# Patient Record
Sex: Female | Born: 1940 | Race: White | Hispanic: No | Smoking: Current every day smoker
Health system: Southern US, Community
[De-identification: ages and names within clinical notes are randomized; demographics above are authoritative.]

## PROBLEM LIST (undated history)

## (undated) DIAGNOSIS — I4891 Unspecified atrial fibrillation: Secondary | ICD-10-CM

## (undated) DIAGNOSIS — S065XAA Traumatic subdural hemorrhage with loss of consciousness status unknown, initial encounter: Secondary | ICD-10-CM

## (undated) DIAGNOSIS — N183 Chronic kidney disease, stage 3 unspecified: Secondary | ICD-10-CM

## (undated) DIAGNOSIS — G4733 Obstructive sleep apnea (adult) (pediatric): Secondary | ICD-10-CM

## (undated) DIAGNOSIS — E785 Hyperlipidemia, unspecified: Secondary | ICD-10-CM

## (undated) DIAGNOSIS — R3129 Other microscopic hematuria: Secondary | ICD-10-CM

## (undated) DIAGNOSIS — I629 Nontraumatic intracranial hemorrhage, unspecified: Secondary | ICD-10-CM

## (undated) DIAGNOSIS — G4730 Sleep apnea, unspecified: Secondary | ICD-10-CM

## (undated) DIAGNOSIS — M1990 Unspecified osteoarthritis, unspecified site: Secondary | ICD-10-CM

## (undated) DIAGNOSIS — Z9289 Personal history of other medical treatment: Secondary | ICD-10-CM

## (undated) DIAGNOSIS — K635 Polyp of colon: Secondary | ICD-10-CM

## (undated) DIAGNOSIS — K5790 Diverticulosis of intestine, part unspecified, without perforation or abscess without bleeding: Secondary | ICD-10-CM

## (undated) DIAGNOSIS — I10 Essential (primary) hypertension: Secondary | ICD-10-CM

## (undated) DIAGNOSIS — M4800 Spinal stenosis, site unspecified: Secondary | ICD-10-CM

## (undated) DIAGNOSIS — S065X9A Traumatic subdural hemorrhage with loss of consciousness of unspecified duration, initial encounter: Secondary | ICD-10-CM

## (undated) HISTORY — DX: Essential (primary) hypertension: I10

## (undated) HISTORY — DX: Obstructive sleep apnea (adult) (pediatric): G47.33

## (undated) HISTORY — DX: Chronic kidney disease, stage 3 unspecified: N18.30

## (undated) HISTORY — DX: Sleep apnea, unspecified: G47.30

## (undated) HISTORY — DX: Diverticulosis of intestine, part unspecified, without perforation or abscess without bleeding: K57.90

## (undated) HISTORY — PX: APPENDECTOMY: SHX54

## (undated) HISTORY — DX: Unspecified osteoarthritis, unspecified site: M19.90

## (undated) HISTORY — DX: Polyp of colon: K63.5

## (undated) HISTORY — DX: Unspecified atrial fibrillation: I48.91

## (undated) HISTORY — DX: Nontraumatic intracranial hemorrhage, unspecified: I62.9

## (undated) HISTORY — DX: Traumatic subdural hemorrhage with loss of consciousness of unspecified duration, initial encounter: S06.5X9A

## (undated) HISTORY — DX: Personal history of other medical treatment: Z92.89

## (undated) HISTORY — DX: Other microscopic hematuria: R31.29

## (undated) HISTORY — DX: Chronic kidney disease, stage 3 (moderate): N18.3

## (undated) HISTORY — DX: Hyperlipidemia, unspecified: E78.5

## (undated) HISTORY — DX: Traumatic subdural hemorrhage with loss of consciousness status unknown, initial encounter: S06.5XAA

## (undated) HISTORY — DX: Spinal stenosis, site unspecified: M48.00

## (undated) HISTORY — PX: CHOLECYSTECTOMY: SHX55

---

## 1997-10-25 ENCOUNTER — Other Ambulatory Visit: Admission: RE | Admit: 1997-10-25 | Discharge: 1997-10-25 | Payer: Self-pay | Admitting: Family Medicine

## 1997-12-08 ENCOUNTER — Encounter: Payer: Self-pay | Admitting: Family Medicine

## 1997-12-08 ENCOUNTER — Ambulatory Visit (HOSPITAL_COMMUNITY): Admission: RE | Admit: 1997-12-08 | Discharge: 1997-12-08 | Payer: Self-pay | Admitting: Family Medicine

## 1997-12-21 ENCOUNTER — Ambulatory Visit (HOSPITAL_COMMUNITY): Admission: RE | Admit: 1997-12-21 | Discharge: 1997-12-21 | Payer: Self-pay | Admitting: Family Medicine

## 1997-12-24 ENCOUNTER — Observation Stay (HOSPITAL_COMMUNITY): Admission: RE | Admit: 1997-12-24 | Discharge: 1997-12-26 | Payer: Self-pay | Admitting: Gastroenterology

## 1997-12-24 ENCOUNTER — Encounter: Payer: Self-pay | Admitting: Gastroenterology

## 1998-01-06 ENCOUNTER — Ambulatory Visit (HOSPITAL_COMMUNITY): Admission: RE | Admit: 1998-01-06 | Discharge: 1998-01-06 | Payer: Self-pay | Admitting: *Deleted

## 1998-12-29 ENCOUNTER — Ambulatory Visit (HOSPITAL_COMMUNITY): Admission: RE | Admit: 1998-12-29 | Discharge: 1998-12-29 | Payer: Self-pay | Admitting: Gastroenterology

## 1998-12-29 ENCOUNTER — Encounter (INDEPENDENT_AMBULATORY_CARE_PROVIDER_SITE_OTHER): Payer: Self-pay | Admitting: Specialist

## 2000-04-11 ENCOUNTER — Other Ambulatory Visit: Admission: RE | Admit: 2000-04-11 | Discharge: 2000-04-11 | Payer: Self-pay | Admitting: Family Medicine

## 2000-12-08 ENCOUNTER — Ambulatory Visit (HOSPITAL_COMMUNITY): Admission: RE | Admit: 2000-12-08 | Discharge: 2000-12-08 | Payer: Self-pay | Admitting: Family Medicine

## 2000-12-08 ENCOUNTER — Encounter: Payer: Self-pay | Admitting: Family Medicine

## 2001-04-08 ENCOUNTER — Encounter (INDEPENDENT_AMBULATORY_CARE_PROVIDER_SITE_OTHER): Payer: Self-pay | Admitting: Specialist

## 2001-04-08 ENCOUNTER — Ambulatory Visit (HOSPITAL_COMMUNITY): Admission: RE | Admit: 2001-04-08 | Discharge: 2001-04-08 | Payer: Self-pay | Admitting: Gastroenterology

## 2008-03-19 ENCOUNTER — Emergency Department (HOSPITAL_COMMUNITY): Admission: EM | Admit: 2008-03-19 | Discharge: 2008-03-19 | Payer: Self-pay | Admitting: Emergency Medicine

## 2008-08-17 ENCOUNTER — Encounter
Admission: RE | Admit: 2008-08-17 | Discharge: 2008-11-15 | Payer: Self-pay | Admitting: Physical Medicine & Rehabilitation

## 2008-08-23 ENCOUNTER — Ambulatory Visit: Payer: Self-pay | Admitting: Physical Medicine & Rehabilitation

## 2008-09-23 ENCOUNTER — Ambulatory Visit: Payer: Self-pay | Admitting: Physical Medicine & Rehabilitation

## 2008-11-04 ENCOUNTER — Ambulatory Visit: Payer: Self-pay | Admitting: Physical Medicine & Rehabilitation

## 2008-12-15 ENCOUNTER — Encounter
Admission: RE | Admit: 2008-12-15 | Discharge: 2009-02-16 | Payer: Self-pay | Admitting: Physical Medicine & Rehabilitation

## 2008-12-16 ENCOUNTER — Ambulatory Visit: Payer: Self-pay | Admitting: Physical Medicine & Rehabilitation

## 2009-03-21 ENCOUNTER — Encounter
Admission: RE | Admit: 2009-03-21 | Discharge: 2009-03-29 | Payer: Self-pay | Admitting: Physical Medicine & Rehabilitation

## 2009-03-29 ENCOUNTER — Ambulatory Visit: Payer: Self-pay | Admitting: Physical Medicine & Rehabilitation

## 2009-05-24 ENCOUNTER — Ambulatory Visit: Payer: Self-pay | Admitting: Physical Medicine & Rehabilitation

## 2009-05-24 ENCOUNTER — Encounter
Admission: RE | Admit: 2009-05-24 | Discharge: 2009-06-03 | Payer: Self-pay | Admitting: Physical Medicine & Rehabilitation

## 2009-06-03 ENCOUNTER — Ambulatory Visit: Payer: Self-pay | Admitting: Physical Medicine & Rehabilitation

## 2010-01-30 IMAGING — CR DG ANKLE COMPLETE 3+V*R*
3 series · 3 of 3 positions shown · non-contrast
Comparison: None

CLINICAL DATA: Lateral ankle pain and swelling secondary to a fall
today.

RIGHT ANKLE - COMPLETE 3+ VIEW

[t ankle joint ap right]
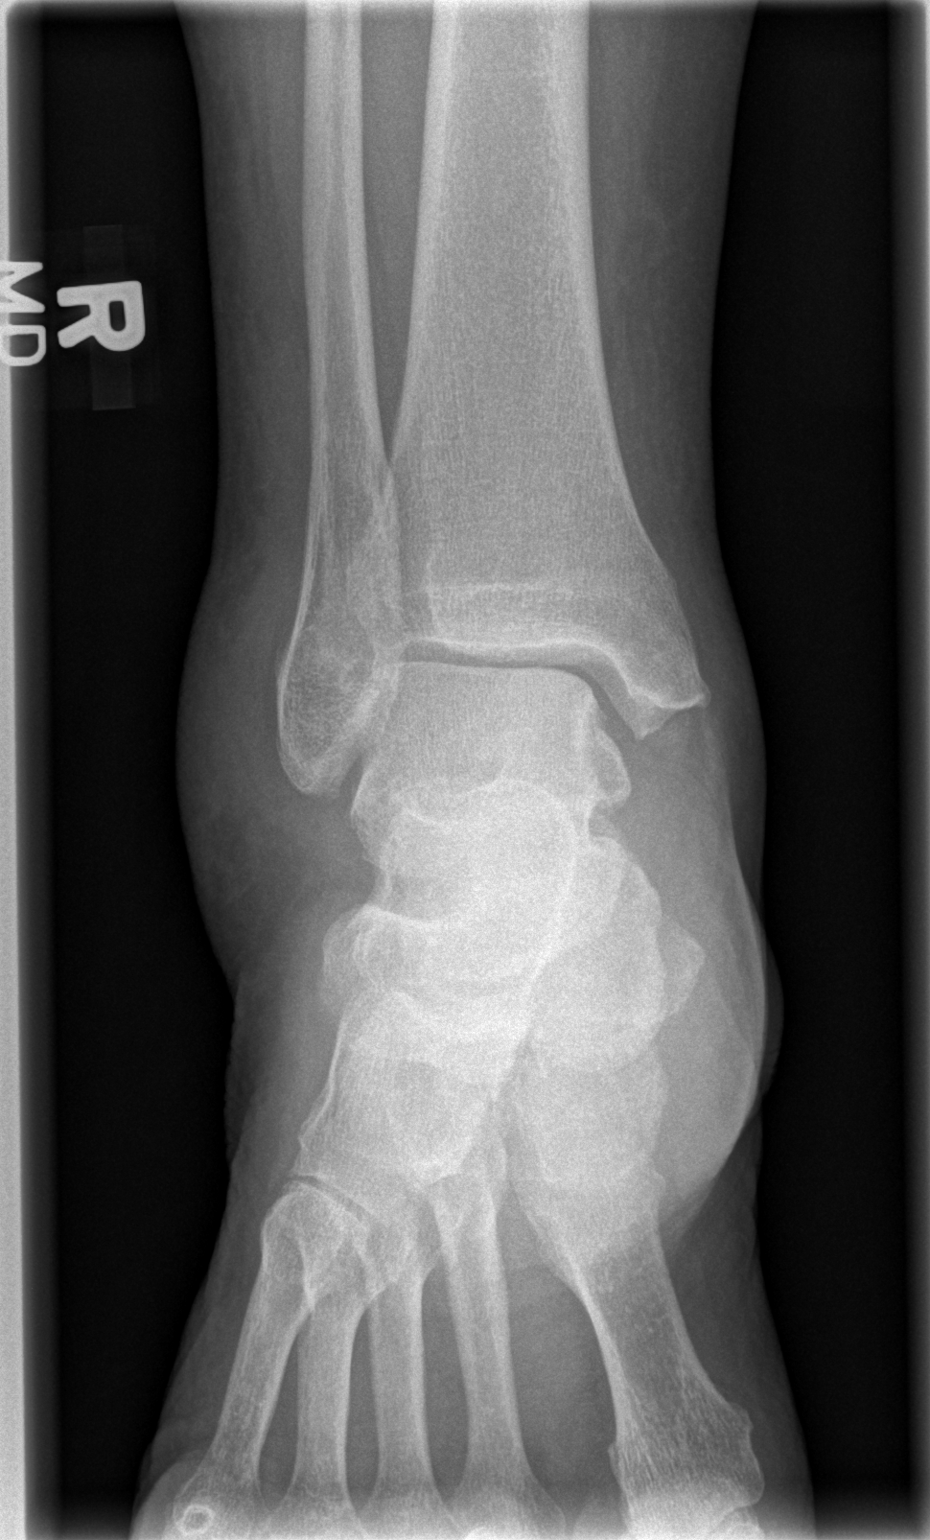

[t ankle joint oblique right]
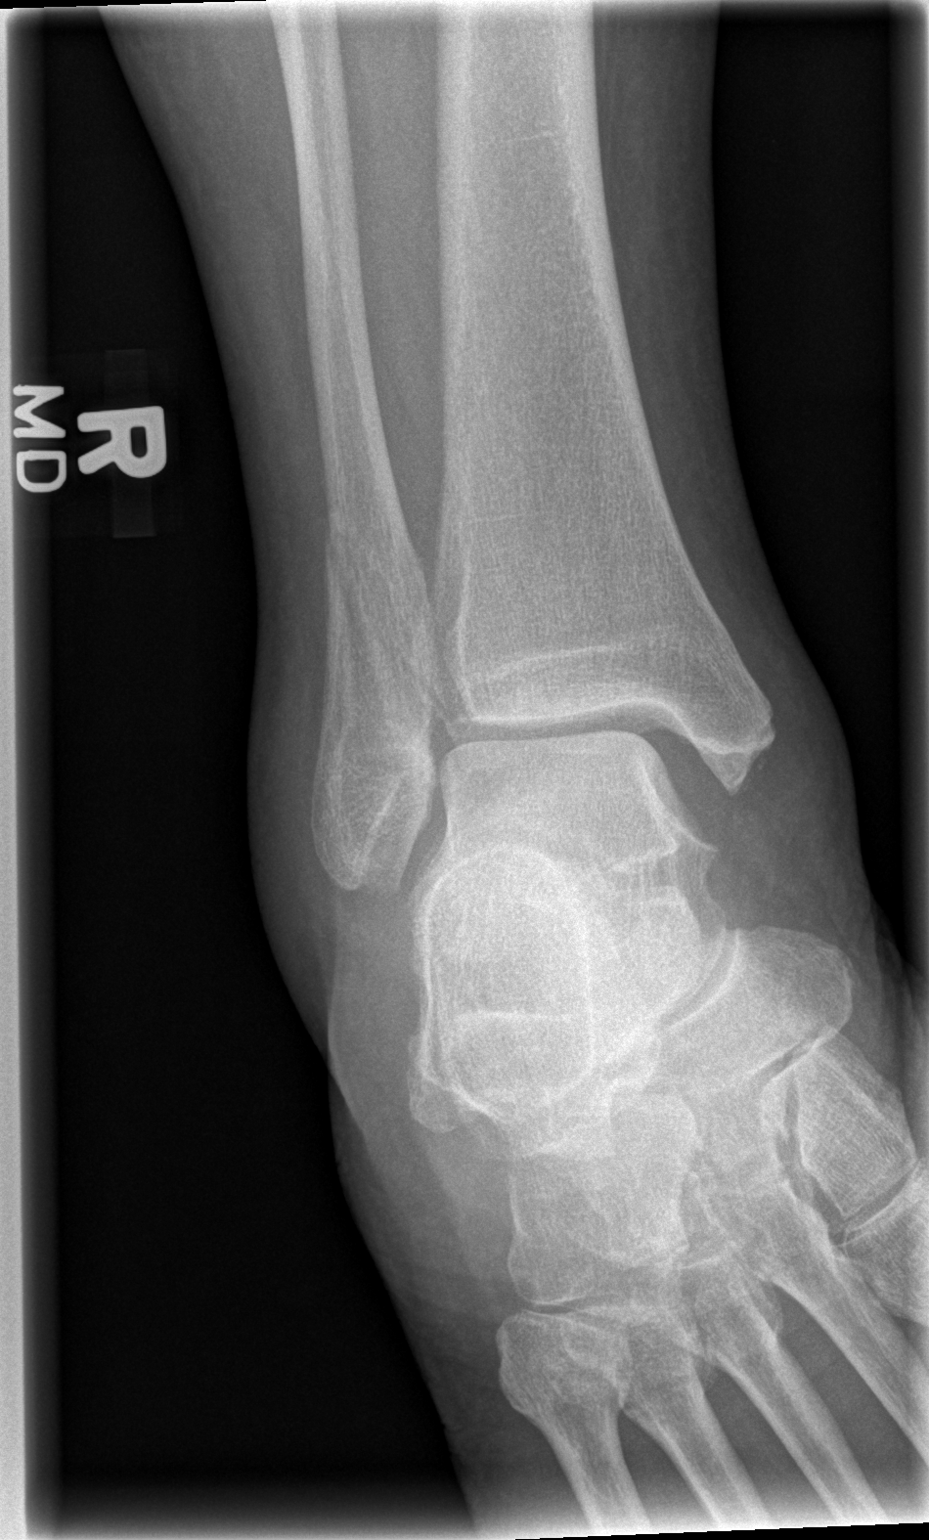

[t ankle joint lat right]
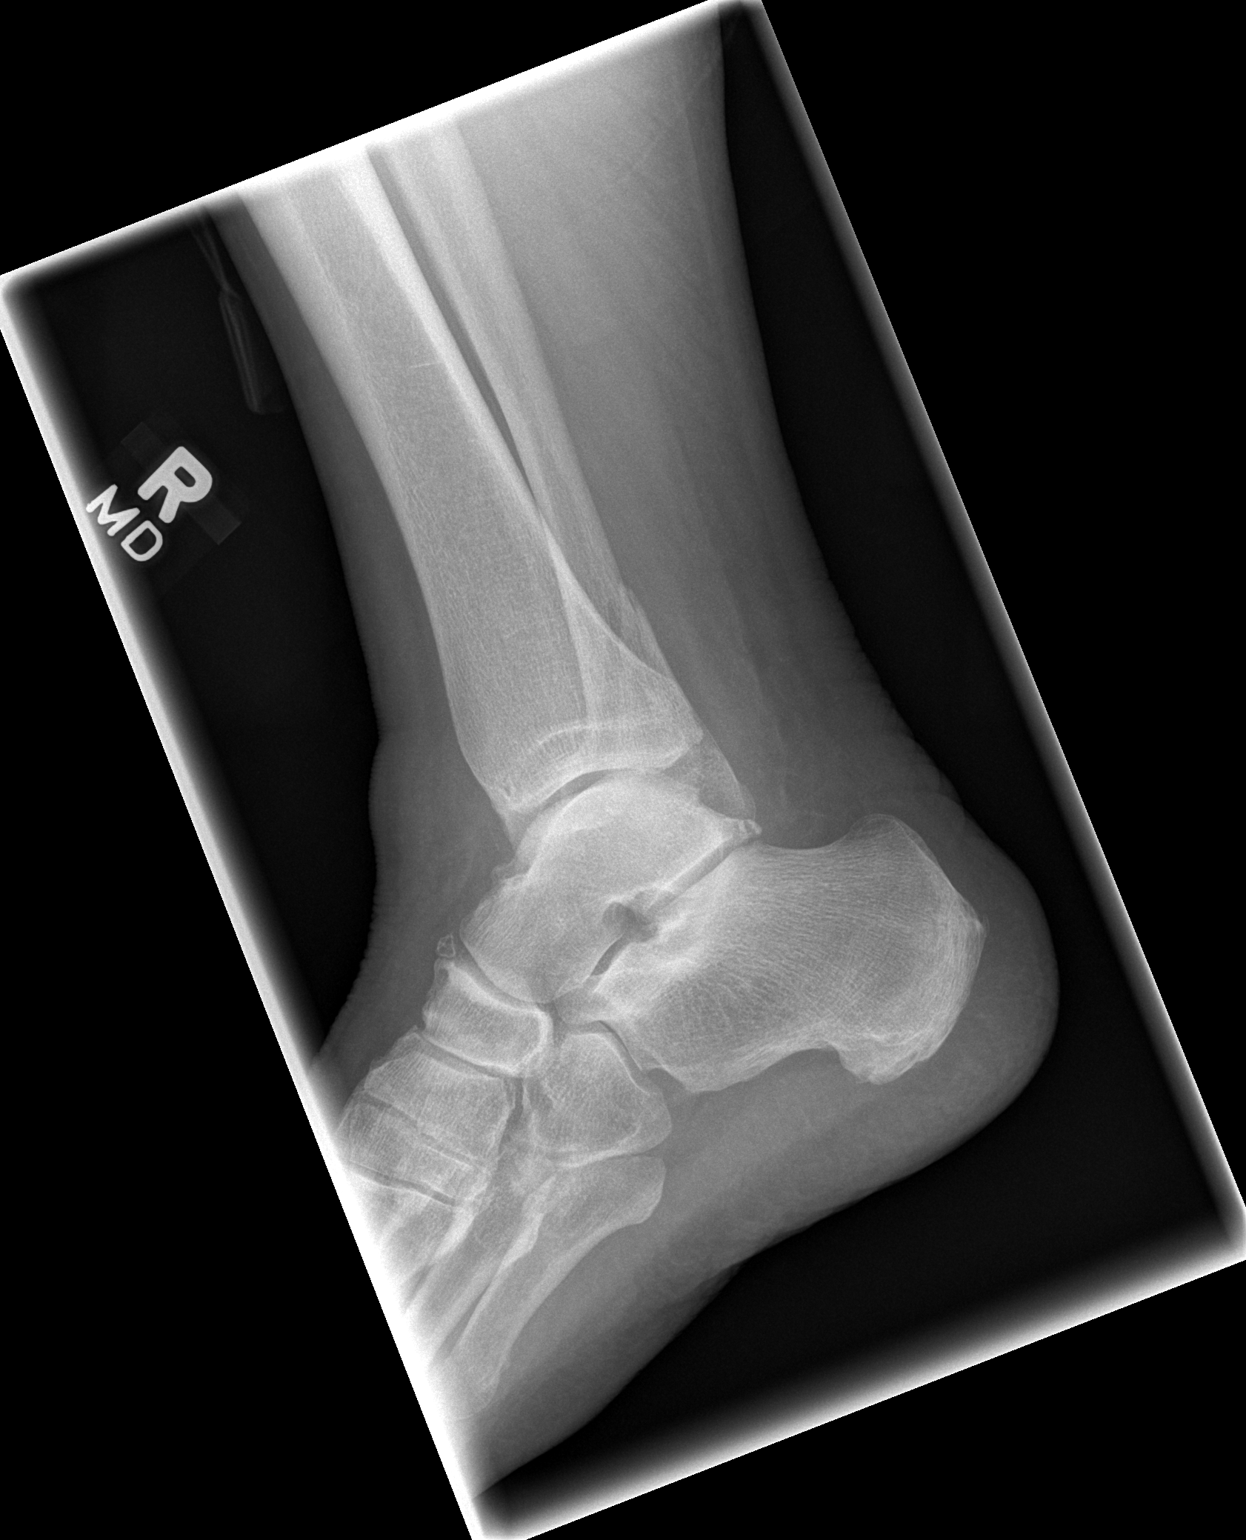

[3 of 3 positions shown; findings below may reference images not displayed]

FINDINGS: There is a slightly displaced spiral fracture of the distal fibula.
There is slight widening of the medial ankle joint.  There is
marked soft tissue swelling over the lateral malleolus.

No other acute abnormalities.
IMPRESSION: Spiral fracture of the distal fibula.  Slight widening of the
medial ankle joint space.

## 2010-05-25 ENCOUNTER — Encounter: Payer: Medicare Other | Attending: Physical Medicine & Rehabilitation

## 2010-05-25 ENCOUNTER — Ambulatory Visit: Payer: Medicare Other | Admitting: Physical Medicine & Rehabilitation

## 2010-05-25 DIAGNOSIS — M47817 Spondylosis without myelopathy or radiculopathy, lumbosacral region: Secondary | ICD-10-CM

## 2010-05-25 DIAGNOSIS — M549 Dorsalgia, unspecified: Secondary | ICD-10-CM | POA: Insufficient documentation

## 2010-05-30 NOTE — Procedures (Signed)
NAMEGARYN, Hannah [name]              ACCOUNT NO.:  [id number]  MEDICAL RECORD NO.:  [id number]            PATIENT TYPE:  LOCATION:                                 FACILITY:  PHYSICIAN:  Erick Colace, M.D.DATE OF BIRTH:  [birth year]  DATE OF PROCEDURE:  05/25/2010 DATE OF DISCHARGE:                              OPERATIVE REPORT  PROCEDURE:  Bilateral L5 dorsal ramus injection, bilateral L4 medial branch block, bilateral L3 medial branch block under fluoroscopic guidance.  Informed consent was obtained after describing risks and benefits of the procedure with the patient.  These include bleeding, bruising and infection.  She elects to proceed and has given consent.  She has had greater than 50% relief for 10 months after last medial branch block performed in June 03, 2009.  The patient is not on Coumadin or Plavix.  Does take some Aleve over the counter.  She is not on any antibiotics.  No recent fever or infections.  Informed consent was obtained after describing risks and benefits of the procedure with the patient.  These include bleeding, bruising and infection.  She elects to proceed and has given written consent.  The patient was placed prone on fluoroscopy table.  Betadine prep, sterilely draped.  A 25-gauge 1-1/2 inch needle was used to anesthetize the skin and subcu tissue 1% lidocaine x1.5 mL at each of the six sites, and a 22- gauge 5-inch spinal needle was inserted under fluoroscopic guidance, first starting at the S1 SAP sacral junction bone contact made and confirmed.  Omnipaque 180 x 0.5 mL demonstrated no intravascular uptake, then 0.5 mL of a solution containing 1 mL of 4 mL dexamethasone and 2 mL of 2% MPF lidocaine in left L5 SAP transverse process junction targeted bone contact made, confirmed.  Omnipaque 180 under live fluoro x 0.5 mL demonstrated no intravascular uptake then 0.5 mL of the dexamethasone lidocaine solution was injected into the left L4 SAP  transverse process junction targeted bone contact made.  Omnipaque 180 x 0.5 mL demonstrated no intravascular uptake and 0.5 mL dexamethasone lidocaine solution injected.  The same procedure was repeated on the right side using same needle injectate and technique.  The patient tolerated procedure well.  Pre and post injection vitals stable.  Post injection instructions given.  She will need to call once this is wearing off. She has had a very long duration of response in the past.  No need for radiofrequency in this situation at this time if she has similar duration of response.     Erick Colace, M.D. Electronically Signed    AEK/MEDQ  D:  05/25/2010 13:04:35  T:  05/25/2010 22:00:00  Job:  [id number]

## 2010-07-11 NOTE — Group Therapy Note (Signed)
A 70 year old female, who complains of back pain that is chronic in  duration starting sometime in 2007 or 2008.  She has had some worsening  over time.  She has been evaluated by spine surgery, found to have  multilevel degenerative changes L3-4, L4-5, L5-S1.  She was not felt to  be a good surgical candidate at that time given that she has not failed  more aggressive conservative care and has been referred for  consideration of facet injection, epidural injections, and pain  management.   She has not tried any physical therapy thus far.  She describes her pain  as being in the low back and upper buttock area.  She has some  tenderness on the left paraspinals as well.  Pain interferes with  activity at 7/10, averages 7/10.  Pain is worse with walking, standing,  improves with rest and even sitting.  Sleep is fair.  Relief from meds  is good.  She can walk about 10 minutes at a time.  She climbs steps.  She drives.   MEDICATIONS:  1. She takes 1 oxycodone a day of the 10/325 formulation.  2. She takes cyclobenzaprine 10 mg a day.  3. Xanax 0.25 mg a day.  4. Meloxicam 50 mg b.i.d.  Dr. Jonny Ruiz prescribes some of her      medications.  She has 36 oxycodone left.  She was filled on June      16.  She has 22 of her Xanax left.   Her other past medical history is fairly unremarkable.  She did fracture  her fibula in a fall earlier this year.  She was treated with a short-  leg cast and nonweightbearing.  She has had good recovery.   Her employment, as a therapeutic foster parent.  She has two 63 year  olds in her home that are having behavioral issues.   REVIEW OF SYSTEMS:  Positive for bladder control problem, numbness,  tingling, trouble walking.  No overt bladder incontinence.   SOCIAL HISTORY:  Divorced.   PHYSICAL EXAMINATION:  VITAL SIGNS:  Blood pressure 147/74, pulse 53,  respirations 18, O2 sat 97% on room air.  GENERAL:  Obese female in no acute distress.  Orientation x3.   Affect  alert.  Gait is normal.  BACK:  Her back has a small subcutaneous tissue approximately 1 cm on  the left side around L3.  No erythema.  Mild tenderness around this.  No  other muscle spasm noted in the back.  She has pain both with forward  flexion and extension of the lumbar spine.  She has more limitation in  the lumbar extension.  She gets about 25% in that where she was about  75% in the forward flexion area.   Her neck range of motion is normal.  Her upper extremity strength and  range of motion are normal.  Lower extremity strength is normal.  Her  range of motion mildly reduced at the hip internal and external  rotation.  Knee and ankle range of motion is full.  Sensation is normal.  Deep tendon reflexes are normal.  Her extremities without edema.  Coordination normal in the upper and lower extremities.  She has normal  tone in the upper and lower extremities.   IMPRESSION:  I reviewed her x-rays with her as well as her CT results.  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  She also has decreased disk height most particularly at L3-4.  RECOMMENDATIONS:  We will set up for medial branch blocks given her age  and x-ray findings most likely pain generator.  Failing this, we may go  to the L3-4 level epidural.  We will combine this with some physical  therapy and working on lower extremity strengthening, lumbar  flexibility, and progressing to some stabilization exercises.   I discussed treatment plan with the patient.  We will check urine drug  screen in regards to narcotic analgesics.  She certainly does not seem  to be using a lot, may be able to get by with hydrocodone or even  tramadol.   The patient is in agreement with plan.      Erick Colace, M.D.  Electronically Signed     AEK/MedQ  D:  08/23/2008 14:54:33  T:  08/24/2008 05:44:08  Job #:  [id number]

## 2010-07-11 NOTE — Procedures (Signed)
Hannah [name], Hannah [name]              ACCOUNT NO.:  [id number]   MEDICAL RECORD NO.:  [id number]          PATIENT TYPE:  REC   LOCATION:  [facility]                          FACILITY:  [facility]   PHYSICIAN:  Erick Colace, M.D.DATE OF BIRTH:  07-26-1940   DATE OF PROCEDURE:  DATE OF DISCHARGE:                               OPERATIVE REPORT   A 70 year old female with mainly axial back pain, left side greater than  right side.  Multilevel degenerative changes at L3-4, L4-5 and L5-S1.   Pain does interfere with activities, is rated as around 7/10 range and  only partially responsive to other conservative care.   Informed consent was obtained after describing the risks and benefits of  the procedure with the patient.  These include bleeding, bruising, and  infection.  She elects to proceed and has given written consent.  The  patient was placed prone on fluoroscopy table.  Betadine prep, sterile  draped.  A 25-gauge 1-1/2-inch needle was used to anesthetize the skin  and subcutaneous tissue with 1% lidocaine x2 mL and a 22-gauge, 3-1/2-  inch spinal needle was inserted under fluoroscopic guidance starting the  left S1 SAP sacral ala junction, bone contact made confirmed with  lateral imaging.  Omnipaque 180 x 0.5 mL demonstrated no intravascular  uptake and 0.5 mL solution containing 3 mL of 2% MPF lidocaine.  Then,  the left L5 SAP transverse process junction, targeted bone contact made.  Omnipaque 180 under live fluoro demonstrated no intravascular uptake,  then 0.5 mL of dexamethasone-lidocaine solution injected.  Then, left L4  SAP transverse process junction, targeted bone contact made.  Omnipaque  180 under live fluoro demonstrated no intravascular uptake and 0.5 mL of  dexamethasone-lidocaine solution was injected.  Then, the left L3 SAP  transverse process junction, targeted bone contact made confirmed with  lateral imaging.  Omnipaque 180 under live fluoro demonstrated no  intravascular uptake and 0.5 mL of dexamethasone-lidocaine solution  injected.  The same procedure was repeated on the right side using same  needle injectate technique at corresponding levels, i.e. L2 medial  branch, L3 medial branch, L4 medial branch, L5 dorsal ramus bilateral.      Erick Colace, M.D.  Electronically Signed     AEK/MEDQ  D:  09/23/2008 10:36:50  T:  09/24/2008 01:43:32  Job:  [id number]   cc:   Corwin Levins, MD  [address]  [place]  [place] [address]

## 2010-07-14 NOTE — Procedures (Signed)
[facility]  Patient:    Hannah [name], Hannah [name] Visit Number: [id number] MRN: [id number]          Service Type: END Location: ENDO Attending Physician:  Nelda Marseille Dictated by:   Petra Kuba, M.D. Proc. Date: 04/08/01 Admit Date:  04/08/2001   CC:         Duncan Dull, M.D.   Procedure Report  PROCEDURE:  Colonoscopy with polypectomy.  INDICATION:  Patient with history of significant colon polyps, due for repeat screening.  Consent was signed after risks, benefits, methods, and options thoroughly discussed multiple times in the past.  MEDICATIONS:  Demerol 60, Versed 6.  DESCRIPTION OF PROCEDURE:  Rectal inspection is pertinent for external hemorrhoids, small.  Digital exam was negative.  The pediatric video adjustable colonoscope was inserted and easily advanced around the colon to the cecum.  This did not require any abdominal pressure or any position changes.  The cecum was identified by the appendiceal orifice and the ileocecal valve.  Other than some left-sided occasional scattered diverticula, no abnormalities were seen on insertion.  The scope was slowly withdrawn.  The prep was adequate.  There was some liquid stool that required washing and suctioning.  In the mid ascending, a small 4 mm polyp was seen, snared, electrocautery applied, and the polyp was suctioned through the scope and collected in the trap.  Possibly there was a minimal amount of residual polypoid tissue left which was hot biopsied x 1.  There was another tiny polyp in the ascending colon near the hepatic flexure which was hot biopsied x 1. No obvious significant abnormality was seen in the ascending.  The scope was slowly withdrawn.  In the transverse, a few tiny polyps were seen and were hot biopsied and put in a second container.  There was also one small to tiny polyp in the descending and the sigmoid which were also hot biopsied.  The scattered left-sided  diverticula were seen but no other significant lesions, as we withdrew back to the rectum.  Once back in the rectum, the scope was retroflexed, pertinent for some internal hemorrhoids.  The scope was straightened and readvanced a short ways up the left side of the colon, air was suctioned and the scope removed.  The patient tolerated the procedure well.  There was no obvious immediate complication.  ENDOSCOPIC DIAGNOSES: 1. Internal and external hemorrhoids. 2. Left-sided diverticula occasionally on the left. 3. Multiple tiny to small polyps, hot biopsied, one in the sigmoid, one in the    descending, and few in the transverse. 4. Two ascending small polyps, one snared and hot biopsied, the other hot    biopsied. 5. Otherwise within normal limits to the cecum.  PLAN:  Await pathology to determine future colonic screening but if no adenomatous changes, probably five years.  Return care to Dr. Kevan Ny.  Happy to see back p.r.n. and otherwise yearly rectals and guaiacs per Dr. Kevan Ny. Dictated by:   Petra Kuba, M.D. Attending Physician:  Nelda Marseille DD:  04/08/01 TD:  04/08/01 Job: [id number]

## 2011-10-16 ENCOUNTER — Encounter: Payer: Self-pay | Admitting: Physical Medicine & Rehabilitation

## 2011-10-16 ENCOUNTER — Encounter: Payer: Medicare Other | Attending: Physical Medicine & Rehabilitation

## 2011-10-16 ENCOUNTER — Ambulatory Visit (HOSPITAL_BASED_OUTPATIENT_CLINIC_OR_DEPARTMENT_OTHER): Payer: Medicare Other | Admitting: Physical Medicine & Rehabilitation

## 2011-10-16 VITALS — BP 147/80 | HR 76 | Resp 16 | Ht 60.0 in | Wt 233.0 lb

## 2011-10-16 DIAGNOSIS — M47817 Spondylosis without myelopathy or radiculopathy, lumbosacral region: Secondary | ICD-10-CM

## 2011-10-16 DIAGNOSIS — M47812 Spondylosis without myelopathy or radiculopathy, cervical region: Secondary | ICD-10-CM | POA: Insufficient documentation

## 2011-10-16 DIAGNOSIS — M542 Cervicalgia: Secondary | ICD-10-CM | POA: Insufficient documentation

## 2011-10-16 DIAGNOSIS — M47816 Spondylosis without myelopathy or radiculopathy, lumbar region: Secondary | ICD-10-CM

## 2011-10-16 NOTE — Progress Notes (Signed)
Subjective:    Patient ID: Hannah [name], female    DOB: 1940/06/04, 71 y.o.   MRN: [id number]  Back Pain Associated symptoms include numbness.  Retired. Lives in retirement community, walking a lot more.  Back pain increasing with walking improves with sitting Neck pain without trauma.  Seen by ortho, Xrays show arthritis Pain Inventory Average Pain 7 Pain Right Now 7 My pain is dull, tingling and aching  In the last 24 hours, has pain interfered with the following? General activity 7 Relation with others 0 Enjoyment of life 9 What TIME of day is your pain at its worst? All Day Sleep (in general) Fair  Pain is worse with: some activites Pain improves with: rest Relief from Meds: 0  Mobility walk without assistance how many minutes can you walk? 3-5 ability to climb steps?  yes do you drive?  yes  Function not employed: date last employed 05/28/11  Neuro/Psych numbness tingling trouble walking  Prior Studies x-rays  Physicians involved in your care Any changes since last visit?  no   Family History  Problem Relation Age of Onset  . Cancer Mother   . Hypertension Sister    History   Social History  . Marital Status: Divorced    Spouse Name: N/A    Number of Children: N/A  . Years of Education: N/A   Social History Main Topics  . Smoking status: Current Everyday Smoker  . Smokeless tobacco: None  . Alcohol Use: None  . Drug Use: None  . Sexually Active: None   Other Topics Concern  . None   Social History Narrative  . None   Past Surgical History  Procedure Date  . Cholecystectomy    Past Medical History  Diagnosis Date  . Hypertension    BP 147/80  Pulse 76  Resp 16  Ht 5' (1.524 m)  Wt 233 lb (105.688 kg)  BMI 45.50 kg/m2  SpO2 90%      Review of Systems  Constitutional: Negative.   HENT: Negative.   Eyes: Negative.   Respiratory: Negative.   Cardiovascular: Negative.   Gastrointestinal: Negative.   Genitourinary:  Negative.   Musculoskeletal: Positive for back pain and gait problem.  Skin: Negative.   Neurological: Positive for numbness.  Hematological: Negative.   Psychiatric/Behavioral: Negative.        Objective:   Physical Exam  Constitutional: She is oriented to person, place, and time.  Musculoskeletal:       Cervical back: She exhibits tenderness. She exhibits normal range of motion, no swelling, no deformity and no spasm.       C5 paraspinal, T5 and L5 paraspinal tenderness  Neurological: She is oriented to person, place, and time. She has normal strength. A sensory deficit is present. Gait normal.  Reflex Scores:      Tricep reflexes are 2+ on the right side and 2+ on the left side.      Bicep reflexes are 2+ on the right side and 2+ on the left side.      Brachioradialis reflexes are 2+ on the right side and 2+ on the left side.      Patellar reflexes are 2+ on the right side and 2+ on the left side.      Achilles reflexes are 2+ on the right side and 2+ on the left side.      Reduced C5 , R L5, B S1 sensation  -SLR  Psychiatric: She has a normal mood and affect.  Assessment & Plan:  1. Lumbar spondylosis she has responded very well in the past to lumbar medial branch blocks we'll set her up for another set 2. Cervical pain likely spondylosis she was seen by orthopedics and told she had arthritis. She has no evidence of radiculopathy other than the C5 distribution numbness. At this point I think it is reasonable for her to go through some physical therapy continue her Anaprox. I'll see her back for the medial branch blocks will ask how she is going with her therapy. May benefit from trigger point injections. No indication for MRI at current time

## 2011-10-16 NOTE — Patient Instructions (Signed)
The physical therapy office will call you to schedule an appointment Your next visit will be for a back injection. You will need a driver with you. You may continue your over-the-counter Aleve 3 times per day

## 2011-10-22 ENCOUNTER — Ambulatory Visit: Payer: Medicare Other | Attending: Physical Medicine & Rehabilitation

## 2011-10-22 DIAGNOSIS — IMO0001 Reserved for inherently not codable concepts without codable children: Secondary | ICD-10-CM | POA: Insufficient documentation

## 2011-10-22 DIAGNOSIS — R293 Abnormal posture: Secondary | ICD-10-CM | POA: Insufficient documentation

## 2011-10-22 DIAGNOSIS — M542 Cervicalgia: Secondary | ICD-10-CM | POA: Insufficient documentation

## 2011-10-24 ENCOUNTER — Ambulatory Visit: Payer: Medicare Other | Admitting: Physical Therapy

## 2011-10-30 ENCOUNTER — Ambulatory Visit: Payer: Medicare Other | Attending: Physical Medicine & Rehabilitation

## 2011-11-01 ENCOUNTER — Ambulatory Visit: Payer: Medicare Other

## 2011-11-06 ENCOUNTER — Ambulatory Visit (HOSPITAL_BASED_OUTPATIENT_CLINIC_OR_DEPARTMENT_OTHER): Payer: Medicare Other | Admitting: Physical Medicine & Rehabilitation

## 2011-11-06 ENCOUNTER — Encounter: Payer: Self-pay | Admitting: Physical Medicine & Rehabilitation

## 2011-11-06 ENCOUNTER — Encounter: Payer: Medicare Other | Attending: Physical Medicine & Rehabilitation

## 2011-11-06 VITALS — BP 147/62 | HR 87 | Resp 18 | Ht 61.5 in | Wt 225.0 lb

## 2011-11-06 DIAGNOSIS — M47816 Spondylosis without myelopathy or radiculopathy, lumbar region: Secondary | ICD-10-CM

## 2011-11-06 DIAGNOSIS — M47817 Spondylosis without myelopathy or radiculopathy, lumbosacral region: Secondary | ICD-10-CM | POA: Insufficient documentation

## 2011-11-06 DIAGNOSIS — M542 Cervicalgia: Secondary | ICD-10-CM | POA: Insufficient documentation

## 2011-11-06 NOTE — Progress Notes (Signed)

## 2011-11-06 NOTE — Progress Notes (Signed)
  PROCEDURE RECORD [facility]   Name: Hannah [name] DOB:01-18-1941 MRN: [id number]  Date:11/06/2011  Physician: Claudette Laws, MD    Nurse/CMA: Redgie Grayer  Allergies: No Known Allergies  Consent Signed: yes  Is patient diabetic? no    Pregnant: yes LMP: No LMP recorded. Patient is postmenopausal. (age 71-55)  Anticoagulants: no Anti-inflammatory: yes (Aleve) Antibiotics: no  Procedure: Bilateral Medial Branch Block  Position: Prone Start Time: 11:15am  End Time: 11:26am  Fluoro Time: 30 seconds  RN/CMA Carroll,CMA Carroll,CMA    Time 10:59 11:30am    BP 147/62 147/67    Pulse 87 77    Respirations 16 16    O2 Sat 98% 97%    S/S 6 6    Pain Level 5/10 5/10     D/C home with Dois Davenport (friend), patient A & O X 3, D/C instructions reviewed, and sits independently.

## 2011-11-06 NOTE — Patient Instructions (Signed)
Facet Block A facet block is an injection procedure used to numb nerves near a spinal joint (facet). The injection usually includes a medicine like Novacaine (anesthetic) and a steroid medicine (similar to cortisone). The injections are made directly into the facet joint of the back. They are used for patients with several types of neck or back pain problems (such as worsening arthritis or persistent pain after surgery) that have not been helped with anti-inflammatory medications, exercise programs, physical therapy, and other forms of pain management. Multiple injections may be needed depending on how many joints are involved.  A facet block procedure can be helpful with diagnosis as well as providing therapeutic pain relief. One of three things may happen after the procedure:  The pain does not go away. This can mean that the pain is probably not coming from blocked facet joints. This information is helpful with diagnosis.   The pain goes away and stays away for a few hours but the original pain comes back and does not get better again. This information is also helpful with diagnosis. It can mean that pain is probably coming from the joints; but the steroid was not helpful for longer term pain control.   The pain goes away after the block, then returns later that day, and then gets better again over the next few days. This can mean that the block was helpful for pain control and the steroid had a longer lasting effect.  If there is good, lasting benefit from the injections, the block may be repeated from 3 to 5 times. If there is good relief but it is only of short-term benefit, other procedures (such as radiofrequency lesioning) may be considered.  Note: The procedure cannot be performed if you have an active infection, a lesion on or near the area of injection, flu, cold, fever, very high blood pressure or if you are on blood thinners. Please make your doctor aware of any of these conditions. This is  for your safety!  LET YOUR CAREGIVER KNOW ABOUT:   Allergies.   Medications taken including herbs, eye drops, over the counter medications, and creams   Use of steroids (by mouth or creams).   Possible pregnancy, if applicable.   Previous problems with anesthetics or Novocaine.   History of blood clots.   History of bleeding or blood problems.   Previous surgery, particularly of the neck and/or back   Other health problems.  RISKS AND COMPLICATIONS These are very uncommon but include:  Bleeding.   Injury to a nerve near the injection site.   Weakness or numbness in areas controlled by nerves near the injection site.   Infection.   Pain at the site of the injection.   Temporary fluid retention in those who are prone to this problem.   Allergic reaction to anesthetics or medicines used during the procedure.  Diabetics may have a temporary increase in their blood sugar after any surgical procedure, especially if steroids are used. Stinging/burning of the numbing medicine is the most uncomfortable part of the procedure; however every person's response to any procedure is individual.  BEFORE THE PROCEDURE   Your caregiver will provide instructions about stopping any medication before the procedure.   Unless advised otherwise, if the injections are in your neck, you may take your medications as usual with a sip of water but do not eat or drink for 6 hours before the procedure.   Unless advised otherwise, you may eat, drink and take your medications as   usual on the day of the procedure (both before and after) if the injections are to be in your lower back.   There is no other specific preparation necessary unless advised otherwise.  PROCEDURE After checking your blood pressure, the procedure will be done in the x-ray (fluoroscopy) room while lying on your stomach. For procedures in the neck, an intravenous line is usually started. The back is then cleansed with an antiseptic  soap. Sterile drapes are placed in this area. The skin is numbed with a local anesthetic. This is felt as a stinging or burning sensation. Using x-ray guidance, needles are then advanced to the appropriate locations. Once the needles are in the proper location, the anesthetic and steroid is injected through the needles and the needles are removed. The skin is then cleansed and bandages are applied. Blood pressure will be checked again, and you will be discharged to leave with your ride after your caregiver says it is okay to go.  AFTER THE PROCEDURE  You may not drive for the remainder of the day after your procedure. An adult must be present to drive you home or to go with you in a taxi or on public transportation. The procedure will be canceled if you do not have a responsible adult with you! This is for your safety.  HOME CARE INSTRUCTIONS   The bandages noted above can be removed on the morning after the procedure.   Resume medications according to your caregiver's instructions.   No heat is to be used near or over the injected area(s) for the remainder of the day.   No tub bath or soaking in water (such as a pool, jacuzzi, etc.) for the remainder of the day.   Some local tenderness may be experienced for a couple of days after the injection. Using an ice pack three or four times a day will help this.   Keep track of the amount of pain relief as well as how long the pain relief lasted.  SEEK MEDICAL CARE IF:   There is drainage from the injection site.   Pain is not controlled with medications prescribed.   There is significant bleeding or swelling.  SEEK IMMEDIATE MEDICAL CARE IF:   You develop a fever of 101 F (38.3 C) or greater.   Worsening pain, swelling, and/or red streaking develops in the skin around the injection site.   Severe pain develops and cannot be controlled with medications prescribed.   You develop any headache, stiff neck, nausea, vomiting, or your eyes become  very sensitive to light.   Weakness or paralysis develops in arms or legs not present before the procedure.   You develop difficulty urinating or difficulty breathing.  Document Released: 07/04/2006 Document Revised: 02/01/2011 Document Reviewed: 06/24/2008 [employer] Patient Information 2012 [employer]. 

## 2011-11-08 ENCOUNTER — Ambulatory Visit: Payer: Medicare Other | Admitting: Physical Therapy

## 2011-11-13 ENCOUNTER — Ambulatory Visit: Payer: Medicare Other

## 2011-11-15 ENCOUNTER — Ambulatory Visit: Payer: Medicare Other

## 2011-11-20 ENCOUNTER — Ambulatory Visit: Payer: Medicare Other

## 2011-11-22 ENCOUNTER — Ambulatory Visit: Payer: Medicare Other

## 2012-01-02 ENCOUNTER — Telehealth: Payer: Self-pay | Admitting: Physical Medicine & Rehabilitation

## 2012-01-02 NOTE — Telephone Encounter (Signed)
Needs to know what meds are in the shots she gets

## 2012-01-02 NOTE — Telephone Encounter (Signed)
Patient is changing insurance and needed to know medication in her injections.  Information given.

## 2012-01-04 ENCOUNTER — Encounter: Payer: Self-pay | Admitting: Physical Medicine & Rehabilitation

## 2012-01-04 ENCOUNTER — Encounter: Payer: Medicare Other | Attending: Physical Medicine & Rehabilitation

## 2012-01-04 ENCOUNTER — Ambulatory Visit (HOSPITAL_BASED_OUTPATIENT_CLINIC_OR_DEPARTMENT_OTHER): Payer: Medicare Other | Admitting: Physical Medicine & Rehabilitation

## 2012-01-04 VITALS — BP 134/66 | HR 85 | Resp 14 | Ht 60.0 in | Wt 228.2 lb

## 2012-01-04 DIAGNOSIS — M47817 Spondylosis without myelopathy or radiculopathy, lumbosacral region: Secondary | ICD-10-CM | POA: Insufficient documentation

## 2012-01-04 DIAGNOSIS — M47816 Spondylosis without myelopathy or radiculopathy, lumbar region: Secondary | ICD-10-CM

## 2012-01-04 DIAGNOSIS — M542 Cervicalgia: Secondary | ICD-10-CM | POA: Insufficient documentation

## 2012-01-04 DIAGNOSIS — M47812 Spondylosis without myelopathy or radiculopathy, cervical region: Secondary | ICD-10-CM

## 2012-01-04 MED ORDER — TRAMADOL-ACETAMINOPHEN 37.5-325 MG PO TABS: 1.0000 | ORAL_TABLET | Freq: Every morning | ORAL | Status: DC

## 2012-01-04 NOTE — Progress Notes (Signed)
  Subjective:    Patient ID: Hannah [name], female    DOB: [birth year], 71 y.o.   MRN: [id number]  HPI 11/06/11 B MBB pain and disability score improved Activity level stable Bilateral arm pain and numbness, not sure if fingers are involved +neck pain No arm weakness no problems with walking Pain Inventory Average Pain 6 Pain Right Now 6 My pain is intermittent  In the last 24 hours, has pain interfered with the following? General activity 0 Relation with others 0 Enjoyment of life 0 What TIME of day is your pain at its worst? evening Sleep (in general) Fair  Pain is worse with: walking and standing Pain improves with: pacing activities Relief from Meds: 4  Mobility walk without assistance how many minutes can you walk? 5  Function not employed: date last employed   Neuro/Psych numbness tingling trouble walking  Prior Studies Any changes since last visit?  no  Physicians involved in your care Any changes since last visit?  no   Family History  Problem Relation Age of Onset  . Cancer Mother   . Hypertension Sister    History   Social History  . Marital Status: Divorced    Spouse Name: N/A    Number of Children: N/A  . Years of Education: N/A   Social History Main Topics  . Smoking status: Current Every Day Smoker  . Smokeless tobacco: Never Used  . Alcohol Use: None  . Drug Use: None  . Sexually Active: None   Other Topics Concern  . None   Social History Narrative  . None   Past Surgical History  Procedure Date  . Cholecystectomy    Past Medical History  Diagnosis Date  . Hypertension    BP 134/66  Pulse 85  Resp 14  Ht 5' (1.524 m)  Wt 228 lb 3.2 oz (103.511 kg)  BMI 44.57 kg/m2  SpO2 96%    Review of Systems  Respiratory: Positive for apnea.   Musculoskeletal: Positive for back pain and gait problem.  Neurological: Positive for numbness.       Tingling       Objective:   Physical Exam  Constitutional: She is oriented to  person, place, and time. She appears well-developed.       obese  Musculoskeletal:       Cervical back: She exhibits decreased range of motion. She exhibits no tenderness, no bony tenderness, no deformity and no spasm.  Neurological: She is alert and oriented to person, place, and time. She has normal strength. No cranial nerve deficit or sensory deficit. Coordination normal.  Psychiatric: She has a normal mood and affect.   Ambulation is normal       Assessment & Plan:  1.  Lumbar spondylosis MBB effective, will have patient call to repeat once symptom worsen 2,  Neck pain may be radiating to arms,  Will cont exercise no change in medication.  If arm weakness occurs will order MRI 3.  Will try ultracet in place of aleve to reduce nephrotoxicity

## 2012-02-04 ENCOUNTER — Other Ambulatory Visit: Payer: Self-pay | Admitting: Physical Medicine and Rehabilitation

## 2012-02-04 DIAGNOSIS — M542 Cervicalgia: Secondary | ICD-10-CM

## 2012-02-04 DIAGNOSIS — M48061 Spinal stenosis, lumbar region without neurogenic claudication: Secondary | ICD-10-CM

## 2012-02-08 ENCOUNTER — Other Ambulatory Visit: Payer: Medicare Other

## 2012-02-09 ENCOUNTER — Other Ambulatory Visit: Payer: Medicare Other

## 2012-02-10 ENCOUNTER — Other Ambulatory Visit: Payer: Medicare Other

## 2013-04-07 ENCOUNTER — Encounter: Payer: Medicare Other | Admitting: Nurse Practitioner

## 2013-04-22 ENCOUNTER — Encounter (HOSPITAL_COMMUNITY): Payer: Medicare Other

## 2013-05-19 ENCOUNTER — Ambulatory Visit (HOSPITAL_COMMUNITY): Payer: Medicare Other | Attending: Family Medicine | Admitting: Radiology

## 2013-05-19 VITALS — BP 143/76 | HR 60 | Ht 61.0 in | Wt 180.0 lb

## 2013-05-19 DIAGNOSIS — R079 Chest pain, unspecified: Secondary | ICD-10-CM

## 2013-05-19 MED ORDER — REGADENOSON 0.4 MG/5ML IV SOLN
0.4000 mg | Freq: Once | INTRAVENOUS | Status: AC
  Administered 2013-05-19: 0.4 mg via INTRAVENOUS

## 2013-05-19 MED ORDER — TECHNETIUM TC 99M SESTAMIBI GENERIC - CARDIOLITE
33.0000 | Freq: Once | INTRAVENOUS | Status: AC | PRN
Start: 2013-05-19 — End: 2013-05-19
  Administered 2013-05-19: 33 via INTRAVENOUS

## 2013-05-19 MED ORDER — TECHNETIUM TC 99M SESTAMIBI GENERIC - CARDIOLITE
11.0000 | Freq: Once | INTRAVENOUS | Status: AC | PRN
  Administered 2013-05-19: 11 via INTRAVENOUS

## 2013-05-19 NOTE — Progress Notes (Signed)
  [facility] SITE 3 NUCLEAR MED [address] , [place] [address] [phone]    Cardiology Nuclear Med Study  Hannah [name] is a 73 y.o. female     MRN : [id number]     DOB: 05-05-1940  Procedure Date: 05/19/2013  Nuclear Med Background Indication for Stress Test:  Evaluation for Ischemia History:  No known CAD Cardiac Risk Factors: Family History - CAD, Lipids, Smoker and HTN  Symptoms:  Chest Pain   Nuclear Pre-Procedure Caffeine/Decaff Intake:  7:00pm NPO After: 10:30pm   Lungs:  clear O2 Sat: 98% on room air. IV 0.9% NS with Angio Cath:  22g  IV Site: R Hand  IV Started by:  Cathlyn Parsonsynthia Hasspacher, RN  Chest Size (in):  38 Cup Size: C  Height: 5\' 1"  (1.549 m)  Weight:  180 lb (81.647 kg)  BMI:  Body mass index is 34.03 kg/(m^2). Tech Comments:  n/a    Nuclear Med Study 1 or 2 day study: 1 day  Stress Test Type:  Treadmill/Lexiscan  Reading MD: n/a  Order Authorizing Provider:  Fabian NovemberSharon Wolters,MD  Resting Radionuclide: Technetium 10251m Sestamibi  Resting Radionuclide Dose: 11.0 mCi   Stress Radionuclide:  Technetium 2551m Sestamibi  Stress Radionuclide Dose: 33.0 mCi           Stress Protocol Rest HR: 60 Stress HR: 93  Rest BP: 143/76 Stress BP: 141/79  Exercise Time (min): n/a METS: n/a           Dose of Adenosine (mg):  n/a Dose of Lexiscan: 0.4 mg  Dose of Atropine (mg): n/a Dose of Dobutamine: n/a mcg/kg/min (at max HR)  Stress Test Technologist: Nelson ChimesSharon Brooks, BS-ES  Nuclear Technologist:  Domenic PoliteStephen Carbone, CNMT     Rest Procedure:  Myocardial perfusion imaging was performed at rest 45 minutes following the intravenous administration of Technetium 5451m Sestamibi. Rest ECG: NSR - Normal EKG  Stress Procedure:  The patient received IV Lexiscan 0.4 mg over 15-seconds with concurrent low level exercise and then Technetium 5351m Sestamibi was injected at 30-seconds while the patient continued walking one more minute.  Quantitative spect images were  obtained after a 45-minute delay. Stress ECG: No significant change from baseline ECG  QPS Raw Data Images:  Normal; no motion artifact; normal heart/lung ratio. Mild breast attenuation.  Stress Images:  Mild decreased uptake at both rest and stress in apex, distal anterior wall consistent with breast attenuation.  Rest Images:  Mild decreased uptake at both rest and stress in apex, distal anterior wall consistent with breast attenuation.  Subtraction (SDS):  Normal Transient Ischemic Dilatation (Normal <1.22):  1.08 Lung/Heart Ratio (Normal <0.45):  0.24  Quantitative Gated Spect Images QGS EDV:  86 ml QGS ESV:  36 ml  Impression Exercise Capacity:  Lexiscan with low level exercise. BP Response:  Normal blood pressure response. Clinical Symptoms:  No significant symptoms noted. ECG Impression:  No significant ST segment change suggestive of ischemia. Comparison with Prior Nuclear Study: No previous nuclear study performed  Overall Impression:  Low risk stress nuclear study with no ischemia. .  LV Ejection Fraction: 58%.  LV Wall Motion:  NL LV Function; NL Wall Motion  Donato SchultzSKAINS, MARK, MD

## 2013-05-20 NOTE — Addendum Note (Signed)
Addended by: Domenic PoliteARBONE, Legend Pecore on: 05/20/2013 09:03 AM   Modules accepted: Orders

## 2014-05-11 ENCOUNTER — Emergency Department (HOSPITAL_COMMUNITY): Payer: Medicare Other

## 2014-05-11 ENCOUNTER — Emergency Department (HOSPITAL_COMMUNITY)
Admission: EM | Admit: 2014-05-11 | Discharge: 2014-05-11 | Disposition: A | Discharge status: Home / Self Care | Payer: Medicare Other | Attending: Emergency Medicine | Admitting: Emergency Medicine

## 2014-05-11 ENCOUNTER — Encounter (HOSPITAL_COMMUNITY): Payer: Self-pay | Admitting: *Deleted

## 2014-05-11 DIAGNOSIS — I10 Essential (primary) hypertension: Secondary | ICD-10-CM | POA: Insufficient documentation

## 2014-05-11 DIAGNOSIS — I4891 Unspecified atrial fibrillation: Secondary | ICD-10-CM | POA: Insufficient documentation

## 2014-05-11 DIAGNOSIS — Z87891 Personal history of nicotine dependence: Secondary | ICD-10-CM | POA: Diagnosis not present

## 2014-05-11 DIAGNOSIS — R000 Tachycardia, unspecified: Secondary | ICD-10-CM | POA: Diagnosis present

## 2014-05-11 DIAGNOSIS — Z79899 Other long term (current) drug therapy: Secondary | ICD-10-CM | POA: Insufficient documentation

## 2014-05-11 LAB — COMPREHENSIVE METABOLIC PANEL
ALT: 20 U/L (ref 0–35)
ANION GAP: 6 (ref 5–15)
AST: 21 U/L (ref 0–37)
Albumin: 4.1 g/dL (ref 3.5–5.2)
Alkaline Phosphatase: 72 U/L (ref 39–117)
BUN: 21 mg/dL (ref 6–23)
CALCIUM: 9.3 mg/dL (ref 8.4–10.5)
CO2: 25 mmol/L (ref 19–32)
CREATININE: 0.9 mg/dL (ref 0.50–1.10)
Chloride: 107 mmol/L (ref 96–112)
GFR, EST AFRICAN AMERICAN: 73 mL/min — AB (ref >90)
GFR, EST NON AFRICAN AMERICAN: 63 mL/min — AB (ref >90)
GLUCOSE: 98 mg/dL (ref 70–99)
Potassium: 3.9 mmol/L (ref 3.5–5.1)
Sodium: 138 mmol/L (ref 135–145)
Total Bilirubin: 0.4 mg/dL (ref 0.3–1.2)
Total Protein: 7.1 g/dL (ref 6.0–8.3)

## 2014-05-11 LAB — CBC
HEMATOCRIT: 42.7 % (ref 36.0–46.0)
Hemoglobin: 14.5 g/dL (ref 12.0–15.0)
MCH: 30.8 pg (ref 26.0–34.0)
MCHC: 34 g/dL (ref 30.0–36.0)
MCV: 90.7 fL (ref 78.0–100.0)
PLATELETS: 214 10*3/uL (ref 150–400)
RBC: 4.71 MIL/uL (ref 3.87–5.11)
RDW: 13.3 % (ref 11.5–15.5)
WBC: 6 10*3/uL (ref 4.0–10.5)

## 2014-05-11 LAB — URINALYSIS, ROUTINE W REFLEX MICROSCOPIC
Bilirubin Urine: NEGATIVE
Glucose, UA: NEGATIVE mg/dL
Hgb urine dipstick: NEGATIVE
Ketones, ur: NEGATIVE mg/dL
LEUKOCYTES UA: NEGATIVE
NITRITE: NEGATIVE
PH: 6.5 (ref 5.0–8.0)
Protein, ur: NEGATIVE mg/dL
SPECIFIC GRAVITY, URINE: 1.012 (ref 1.005–1.030)
UROBILINOGEN UA: 0.2 mg/dL (ref 0.0–1.0)

## 2014-05-11 LAB — I-STAT TROPONIN, ED: Troponin i, poc: 0.01 ng/mL (ref 0.00–0.08)

## 2014-05-11 LAB — PROTIME-INR
INR: 1.02 (ref 0.00–1.49)
PROTHROMBIN TIME: 13.5 s (ref 11.6–15.2)

## 2014-05-11 LAB — APTT: APTT: 33 s (ref 24–37)

## 2014-05-11 MED ORDER — DILTIAZEM HCL ER COATED BEADS 120 MG PO CP24: 120.0000 mg | ORAL_CAPSULE | Freq: Every day | ORAL | Status: DC

## 2014-05-11 MED ORDER — CEPHALEXIN 500 MG PO CAPS: 500.0000 mg | ORAL_CAPSULE | Freq: Four times a day (QID) | ORAL | Status: DC

## 2014-05-11 MED ORDER — DILTIAZEM HCL 25 MG/5ML IV SOLN
15.0000 mg | Freq: Once | INTRAVENOUS | Status: AC
  Administered 2014-05-11: 15 mg via INTRAVENOUS
  Filled 2014-05-11: qty 5

## 2014-05-11 MED ORDER — ASPIRIN 81 MG PO CHEW: 324.0000 mg | CHEWABLE_TABLET | Freq: Every day | ORAL | Status: DC

## 2014-05-11 MED ORDER — DILTIAZEM LOAD VIA INFUSION: 15.0000 mg | Freq: Once | INTRAVENOUS | Status: DC

## 2014-05-11 MED ORDER — SODIUM CHLORIDE 0.9 % IV SOLN
1000.0000 mL | INTRAVENOUS | Status: DC
  Administered 2014-05-11: 1000 mL via INTRAVENOUS

## 2014-05-11 NOTE — ED Provider Notes (Signed)
CSN: [id number]     Arrival date & time 05/11/14  1611 History   First MD Initiated Contact with Patient 05/11/14 1616     Chief Complaint  Patient presents with  . Tachycardia    HPI Pt went to her doctors office today to get checked for a uti.  When she was getting her vitals they noticed her heart rate was high.  She was sent to the ED for further evaluation.  She denies chest pain or shortness of breath.  No dizziness or lightheadedness except a 3 second episode last week where she felt like her blood sugar was low and she needed to eat something.  No history of similar problems in the past.   PCP Drue Dun PA) Past Medical History  Diagnosis Date  . Hypertension    Past Surgical History  Procedure Laterality Date  . Cholecystectomy     Family History  Problem Relation Age of Onset  . Cancer Mother   . Hypertension Sister    History  Substance Use Topics  . Smoking status: Former Smoker    Types: Cigarettes  . Smokeless tobacco: Never Used  . Alcohol Use: Yes     Comment: twice a week   OB History    No data available     Review of Systems  All other systems reviewed and are negative.     Allergies  Review of patient's allergies indicates no known allergies.  Home Medications   Prior to Admission medications   Medication Sig Start Date End Date Taking? Authorizing Provider  ALPRAZolam Prudy Feeler) 0.25 MG tablet Take 1 tablet by mouth At bedtime as needed for sleep.  01/03/12  Yes Historical Provider, MD  buPROPion (WELLBUTRIN XL) 150 MG 24 hr tablet Take 150 mg by mouth daily.   Yes Historical Provider, MD  escitalopram (LEXAPRO) 10 MG tablet Take 10 mg by mouth daily.   Yes Historical Provider, MD  hydrochlorothiazide (HYDRODIURIL) 25 MG tablet Take 12.5 mg by mouth Daily.  12/04/11  Yes Historical Provider, MD  aspirin 81 MG chewable tablet Chew 4 tablets (324 mg total) by mouth daily. 05/11/14   Linwood Dibbles, MD  cephALEXin (KEFLEX) 500 MG capsule Take 1  capsule (500 mg total) by mouth 4 (four) times daily. 05/11/14   Linwood Dibbles, MD  diltiazem (CARDIZEM CD) 120 MG 24 hr capsule Take 1 capsule (120 mg total) by mouth daily. 05/11/14   Linwood Dibbles, MD  traMADol-acetaminophen (ULTRACET) 37.5-325 MG per tablet Take 1 tablet by mouth every morning. Patient not taking: Reported on 05/11/2014 01/04/12   Erick Colace, MD   BP 107/65 mmHg  Pulse 114  Temp(Src) 97.6 F (36.4 C) (Oral)  Resp 18  SpO2 96% Physical Exam  Constitutional: She appears well-developed and well-nourished. No distress.  HENT:  Head: Normocephalic and atraumatic.  Right Ear: External ear normal.  Left Ear: External ear normal.  Eyes: Conjunctivae are normal. Right eye exhibits no discharge. Left eye exhibits no discharge. No scleral icterus.  Neck: Neck supple. No tracheal deviation present.  Cardiovascular: Intact distal pulses.  An irregularly irregular rhythm present. Tachycardia present.   Pulmonary/Chest: Effort normal and breath sounds normal. No stridor. No respiratory distress. She has no wheezes. She has no rales.  Abdominal: Soft. Bowel sounds are normal. She exhibits no distension. There is no tenderness. There is no rebound and no guarding.  Musculoskeletal: She exhibits no edema or tenderness.  Neurological: She is alert. She has normal strength. No  cranial nerve deficit (no facial droop, extraocular movements intact, no slurred speech) or sensory deficit. She exhibits normal muscle tone. She displays no seizure activity. Coordination normal.  Skin: Skin is warm and dry. No rash noted.  Psychiatric: She has a normal mood and affect.  Nursing note and vitals reviewed.   ED Course  Procedures (including critical care time) Labs Review Labs Reviewed  COMPREHENSIVE METABOLIC PANEL - Abnormal; Notable for the following:    GFR calc non Af Amer 63 (*)    GFR calc Af Amer 73 (*)    All other components within normal limits  CBC  PROTIME-INR  APTT  URINALYSIS,  ROUTINE W REFLEX MICROSCOPIC  I-STAT TROPOININ, ED    Imaging Review Dg Chest 2 View  05/11/2014   CLINICAL DATA:  Irregular heart rhythm noted on recent checkup. Patient stopped smoking 3 months ago.  EXAM: CHEST  2 VIEW  COMPARISON:  CT of the abdomen and pelvis on 07/16/2012  FINDINGS: Heart size is upper limits normal. There are no focal consolidations or pleural effusions. No pulmonary edema. There is atherosclerotic calcification of the thoracic aorta.  IMPRESSION: No evidence for acute  abnormality.   Electronically Signed   By: Norva PavlovElizabeth  Brown M.D.   On: 05/11/2014 17:00     EKG Interpretation   Date/Time:  Tuesday May 11 2014 16:17:03 EDT Ventricular Rate:  124 PR Interval:    QRS Duration: 84 QT Interval:  314 QTC Calculation: 451 R Axis:   84 Text Interpretation:  Atrial fibrillation vs atrial fluuter Borderline  right axis deviation Borderline ST depression, lateral leads No old  tracing to compare Reconfirmed by Elsworth Ledin  MD-J, Micholas Drumwright [phone] on 05/11/2014  4:30:11 PM     Medications  0.9 %  sodium chloride infusion (1,000 mLs Intravenous New Bag/Given 05/11/14 1749)  diltiazem (CARDIZEM) injection 15 mg (15 mg Intravenous New Bag/Given 05/11/14 1827)    MDM   Final diagnoses:  Atrial fibrillation with rapid ventricular response    Pt has new onset a fib/flutter.  Asymptomatic.  Onset is unknown.  Chds2vasc score is 3.  Discussed with cardiology.  Will start pt on asa and cardizem cd.  They will talk to her in the office about anticoagulation in the next couple of days.  Pt did not get an ex for her uti but she was told by her doctor that she did have one.  Ua sent off in the ED.  Will start her on kelfex.    Linwood DibblesJon Gram Siedlecki, MD 05/11/14 [phone]

## 2014-05-11 NOTE — ED Notes (Signed)
Patient verbalizes understanding of discharge instructions, prescription medications, home care and follow up care. Patient ambulatory out of department at this time with family. 

## 2014-05-11 NOTE — ED Notes (Signed)
Per EMS they were contacted by provider office d/t pt increased HR, No cardiac hx, ST 140, PVCs. Denies CP, SOB , H/A,  A&Ox4, ambulatory without assist

## 2014-05-11 NOTE — ED Notes (Signed)
Bed: ZO10WA24 Expected date:  Expected time:  Means of arrival:  Comments: Ems- 74 yo tachy

## 2014-05-11 NOTE — Discharge Instructions (Signed)

## 2014-05-17 ENCOUNTER — Encounter: Payer: Self-pay | Admitting: Physician Assistant

## 2014-05-17 ENCOUNTER — Ambulatory Visit (INDEPENDENT_AMBULATORY_CARE_PROVIDER_SITE_OTHER): Payer: Medicare Other | Admitting: Physician Assistant

## 2014-05-17 VITALS — BP 154/82 | HR 73 | Ht 61.0 in | Wt 220.0 lb

## 2014-05-17 DIAGNOSIS — G4733 Obstructive sleep apnea (adult) (pediatric): Secondary | ICD-10-CM

## 2014-05-17 DIAGNOSIS — I480 Paroxysmal atrial fibrillation: Secondary | ICD-10-CM | POA: Diagnosis not present

## 2014-05-17 DIAGNOSIS — I10 Essential (primary) hypertension: Secondary | ICD-10-CM

## 2014-05-17 DIAGNOSIS — Z8679 Personal history of other diseases of the circulatory system: Secondary | ICD-10-CM

## 2014-05-17 NOTE — Patient Instructions (Addendum)
YOU HAVE BEEN REFERRED TO NEUROSURGERY; DX HX OF SUBDURAL HEMATOMA; CAN PT TAKE AN ANTICOAGULANT; PLEASE LET [place] [phone]  Your physician has recommended that you wear an 30 DAYS event monitor. Event monitors are medical devices that record the heart's electrical activity. Doctors most often us these monitors to diagnose arrhythmias. Arrhythmias are problems with the speed or rhythm of the heartbeat. The monitor is a small, portable device. You can wear one while you do your normal daily activities. This is usually used to diagnose what is causing palpitations/syncope (passing out).  Your physician has requested that you have an echocardiogram. Echocardiography is a painless test that uses sound waves to create images of your heart. It provides your doctor with information about the size and shape of your heart and how well your heart's chambers and valves are working. This procedure takes approximately one hour. There are no restrictions for this procedure.  Your physician recommends that you return for lab work in: TODAY TSH  Your physician recommends that you schedule a follow-up appointment in: 4-6 WEEKS WITH DR. Katrinka BlazingSMITH PER DR. Katrinka BlazingSMITH

## 2014-05-17 NOTE — Progress Notes (Signed)
Cardiology Office Note   Date:  05/17/2014   ID:  Hannah Fleetancy Castille, DOB Aug 10, 1940, MRN [id number]  PCP:  Hollice EspyGATES,DONNA RUTH, MD  Cardiologist:  New - Dr. Verdis PrimeHenry Smith     Chief Complaint  Patient presents with  . New Patient  . Atrial Fibrillation     History of Present Illness: Hannah [name] is a 74 y.o. female with a hx of HTN, spinal stenosis with chronic pain, DJD, CKD, OSA, HL, remote subdural hematoma in 1987.  She was seen by her PCP 05/11/14 for a UTI.  She was noted to be in SVT with HR 140.  She was sent to the ED.  ECG demonstrated AFib.  EDP apparently talked to someone from cardiology and Cardizem and ASA was started.  She was set up for FU today.    CHADS2-VASc=3 (female, HTN, > 72 yo).  She had no symptoms of palpitations when her heart was out of rhythm.  The patient denies chest pain, shortness of breath, syncope, orthopnea, PND or significant pedal edema. She has felt fine since she was in the ED.  She is taking Diltiazem.  She does have a hx of subdural hematoma in 1987.  She tells me that she was in the hospital for 6 weeks.  [facility] [place].  She was never told that she could not be on blood thinners. She tells me that her brother and sister both had the same thing.  However, they both died from this.      Studies/Reports Reviewed Today:  Myoview 04/2013 Low risk stress nuclear study with no ischemia. .LV Ejection Fraction: 58%.  LV Wall Motion:  NL LV Function; NL Wall Motion    Past Medical History  Diagnosis Date  . Hypertension   . HLD (hyperlipidemia)     no medication  . OSA (obstructive sleep apnea)     suspected - never tested  . Spinal stenosis     chronic pain  . DJD (degenerative joint disease)   . CKD (chronic kidney disease) stage 3, GFR 30-59 ml/min   . Colon polyps   . Diverticulosis   . Microscopic hematuria   . Subdural hematoma     1987 - [facility] [place]  . Hx of cardiovascular stress test     Myoview  3/15: No ischemia EF 58%, low risk    Past Surgical History  Procedure Laterality Date  . Cholecystectomy       Current Outpatient Prescriptions  Medication Sig Dispense Refill  . ALPRAZolam (XANAX) 0.25 MG tablet Take 1 tablet by mouth At bedtime as needed for sleep.     Marland Kitchen. aspirin 81 MG chewable tablet Chew 4 tablets (324 mg total) by mouth daily. 120 tablet 0  . buPROPion (WELLBUTRIN XL) 150 MG 24 hr tablet Take 150 mg by mouth daily.    . cephALEXin (KEFLEX) 500 MG capsule Take 1 capsule (500 mg total) by mouth 4 (four) times daily. 20 capsule 0  . diltiazem (CARDIZEM CD) 120 MG 24 hr capsule Take 1 capsule (120 mg total) by mouth daily. 30 capsule 0  . escitalopram (LEXAPRO) 10 MG tablet Take 10 mg by mouth daily.    . hydrochlorothiazide (HYDRODIURIL) 25 MG tablet Take 12.5 mg by mouth Daily.      No current facility-administered medications for this visit.    Allergies:   Review of patient's allergies indicates no known allergies.    Social History:  The patient  reports that she has quit smoking. Her smoking use included Cigarettes. She has never used smokeless tobacco. She reports that she drinks about 0.6 oz of alcohol per week. She reports that she does not use illicit drugs.   Family History:  The patient's family history includes Cancer in her mother; Hypertension in her sister; Other in her sister.    ROS:   Please see the history of present illness.   Review of Systems  Hematologic/Lymphatic: Bruises/bleeds easily.  Psychiatric/Behavioral: Positive for depression.  All other systems reviewed and are negative.    PHYSICAL EXAM: VS:  BP 154/82 mmHg  Pulse 73  Ht  (1.549 m)  Wt 220 lb (99.791 kg)  BMI 41.59 kg/m2  SpO2 99%    Wt Readings from Last 3 Encounters:  05/17/14 220 lb (99.791 kg)  05/19/13 180 lb (81.647 kg)  01/04/12 228 lb 3.2 oz (103.511 kg)     GEN: Well nourished, well developed, in no acute distress HEENT: normal Neck: no JVD, no  carotid bruits, no masses Cardiac:  Normal S1/S2, RRR; no murmur ,  no rubs or gallops, no edema  Respiratory:  clear to auscultation bilaterally, no wheezing, rhonchi or rales. GI: soft, nontender, nondistended, + BS MS: no deformity or atrophy Skin: warm and dry  Neuro:  CNs II-XII intact, Strength and sensation are intact Psych: Normal affect   EKG:  EKG is ordered today.  It demonstrates:   NSR, HR 62, normal axis, no ST changes EKG from PCPs office demonstrated SVT with a heart rate 140 (question atrial flutter versus atrial tachycardia). EKG from the emergency room demonstrated atrial fibrillation, HR 124.  Recent Labs: 05/11/2014: ALT 20; BUN 21; Creatinine 0.90; Hemoglobin 14.5; Platelets 214; Potassium 3.9; Sodium 138    Lipid Panel No results found for: CHOL, TRIG, HDL, CHOLHDL, VLDL, LDLCALC, LDLDIRECT    ASSESSMENT AND PLAN:  PAF (paroxysmal atrial fibrillation) She was asymptomatic with her AFib.  She is back in NSR today. Atrial fibrillation occurred in the context of a UTI. However, it is doubtful this illness was enough to cause AFib.  She is likely at risk for AFib due to HTN, OSA.  CHADS2-VASc=3.  Annual stroke risk is 4.3%.  She would benefit from anticoagulation with a 66% relative risk reduction. However, she has a hx of subdural hematoma years ago.  She tells me that she had 2 siblings die from this.  Question if she had an AVM that bled.  She had some FU with Neurosurgery for a while but has not seen anyone in years. It is not clear what her risk of anticoagulation is at this point.  The patient was also seen by Dr. Verdis Prime today.    -  Continue  ASA and Diltiazem for now.     -  Arrange Event monitor to assess AFib burden.  Consider anti-arrhythmic drug therapy if she has high AFib burden.    -  Arrange 2D Echo.    -  Check TSH.    -  Refer to Neurosurgery for FU and to assess her risk of long term anticoagulation.    Essential hypertension BP elevated.  Continue current dose of Diltiazem, HCTZ.  Continue to monitor.    OSA (obstructive sleep apnea)  I have encouraged her to get her sleep study.  We discussed the importance of treating OSA as it relates to atrial arrhythmias.    Hx of Subdural Hematoma As noted, will get Neurosurgery FU arranged.  Biggest question from our standpoint is whether or not she is a candidate for anticoagulation.   Current medicines are reviewed at length with the patient today.  The patient does not have concerns regarding medicines.  The following changes have been made:  As above.   Labs/ tests ordered today include:   Orders Placed This Encounter  Procedures  . TSH  . Ambulatory referral to Neurosurgery  . EKG 12-Lead  . Cardiac event monitor  . 2D Echocardiogram without contrast    Disposition:   FU with Dr. Verdis Prime 4-6 weeks.  We will determine whether to put her on Anticoagulation at that time.   Signed, Brynda Rim, MHS 05/17/2014 4:23 PM    [facility] [address] [place], [place], [place]  [address] Phone: [phone]; Fax: [phone]

## 2014-05-18 LAB — TSH: TSH: 2.07 u[IU]/mL (ref 0.35–4.50)

## 2014-05-19 ENCOUNTER — Encounter (INDEPENDENT_AMBULATORY_CARE_PROVIDER_SITE_OTHER): Payer: Medicare Other

## 2014-05-19 ENCOUNTER — Telehealth: Payer: Self-pay | Admitting: Physician Assistant

## 2014-05-19 ENCOUNTER — Encounter: Payer: Self-pay | Admitting: Radiology

## 2014-05-19 DIAGNOSIS — I480 Paroxysmal atrial fibrillation: Secondary | ICD-10-CM | POA: Diagnosis not present

## 2014-05-19 NOTE — Telephone Encounter (Signed)
ptcb and has been notified of normal lab results with verbal understanding 

## 2014-05-19 NOTE — Telephone Encounter (Signed)
New Msg       Pt states she is returning call about lab results from Monday 05/17/14.   Please return pt call.

## 2014-05-19 NOTE — Progress Notes (Signed)
Patient ID: Hannah [name], female   DOB: 09-18-1940, 74 y.o.   MRN: [id number] [employer] 30 day monitor applied. EOS 06-18-14

## 2014-05-25 ENCOUNTER — Ambulatory Visit (HOSPITAL_COMMUNITY): Payer: Medicare Other

## 2014-05-28 ENCOUNTER — Ambulatory Visit (HOSPITAL_COMMUNITY): Payer: Medicare Other | Attending: Cardiology | Admitting: Radiology

## 2014-05-28 DIAGNOSIS — I480 Paroxysmal atrial fibrillation: Secondary | ICD-10-CM | POA: Insufficient documentation

## 2014-05-28 NOTE — Progress Notes (Signed)
Echocardiogram performed.  

## 2014-05-30 ENCOUNTER — Encounter: Payer: Self-pay | Admitting: Physician Assistant

## 2014-05-31 ENCOUNTER — Telehealth: Payer: Self-pay | Admitting: *Deleted

## 2014-05-31 NOTE — Telephone Encounter (Signed)
pt notified about echo results with verbal understanding 

## 2014-06-11 ENCOUNTER — Other Ambulatory Visit: Payer: Self-pay | Admitting: *Deleted

## 2014-06-11 ENCOUNTER — Encounter: Payer: Self-pay | Admitting: *Deleted

## 2014-06-11 MED ORDER — DILTIAZEM HCL ER COATED BEADS 120 MG PO CP24: 120.0000 mg | ORAL_CAPSULE | Freq: Every day | ORAL | Status: DC

## 2014-07-06 NOTE — Progress Notes (Signed)
Cardiology Office Note   Date:  07/06/2014   ID:  Hannah [name], DOB [birth year], MRN [id number]  PCP:  Hollice EspyGATES,DONNA RUTH, MD  Cardiologist:  Dr. Verdis PrimeHenry Smith     Chief Complaint  Patient presents with  . Atrial Fibrillation     History of Present Illness: Hannah [name] is a 74 y.o. female with a hx of HTN, spinal stenosis with chronic pain, DJD, CKD, OSA, HL, remote subdural hematoma in 1987.  She was seen by her PCP 05/11/14 for a UTI.  She was noted to be in SVT with HR 140.  She was sent to the ED.  ECG demonstrated AFib.  EDP started Cardizem and ASA.  CHADS2-VASc=3 (female, HTN, > 72 yo).  Evaluated by me and Dr. Verdis PrimeHenry Smith 05/17/14.  She was back in NSR.  Given her hx of remote subdural hematoma, we felt that she should FU with neurosurgery to assess risk of oral anticoagulation.  She was continued on ASA.  Echo was obtained and this demonstrated normal LVF.  Event monitor was obtained to assess AFib burden.  This demonstrated .  She returns for FU.     Studies/Reports Reviewed Today: Echo 05/28/14 - Left ventricle: EF 55% to 60%. Wall motion was normal;  Left ventricular diastolic function parameters were normal. - Mitral valve: Calcified annulus. - Left atrium: The atrium was mildly dilated. - Right atrium: The atrium was mildly dilated. - Atrial septum: There was increased thickness of the septum, consistent with lipomatous hypertrophy. Impressions: Normal LV function; mild biatrial enlargment; trace MR and TR.  Myoview 04/2013 Low risk stress nuclear study with no ischemia. .LV Ejection Fraction: 58%.  LV Wall Motion:  NL LV Function; NL Wall Motion    Past Medical History  Diagnosis Date  . Hypertension   . HLD (hyperlipidemia)     no medication  . OSA (obstructive sleep apnea)     suspected - never tested  . Spinal stenosis     chronic pain  . DJD (degenerative joint disease)   . CKD (chronic kidney disease) stage 3, GFR 30-59 ml/min   . Colon polyps   .  Diverticulosis   . Microscopic hematuria   . Subdural hematoma     1987 - treated at [facility] in [place]  . Hx of cardiovascular stress test     Myoview 3/15: No ischemia EF 58%, low risk  . Hx of echocardiogram     Echo 4/16: EF 55-60%, no RWMA, mild BAE, atrial lipomatous hypertrophy, trivial MR, trivial TR    Past Surgical History  Procedure Laterality Date  . Cholecystectomy       Current Outpatient Prescriptions  Medication Sig Dispense Refill  . ALPRAZolam (XANAX) 0.25 MG tablet Take 1 tablet by mouth At bedtime as needed for sleep.     Marland Kitchen. aspirin 81 MG chewable tablet Chew 4 tablets (324 mg total) by mouth daily. 120 tablet 0  . buPROPion (WELLBUTRIN XL) 150 MG 24 hr tablet Take 150 mg by mouth daily.    . cephALEXin (KEFLEX) 500 MG capsule Take 1 capsule (500 mg total) by mouth 4 (four) times daily. 20 capsule 0  . diltiazem (CARDIZEM CD) 120 MG 24 hr capsule Take 1 capsule (120 mg total) by mouth daily. 30 capsule 0  . escitalopram (LEXAPRO) 10 MG tablet Take 10 mg by mouth daily.    . hydrochlorothiazide (HYDRODIURIL) 25 MG tablet Take 12.5 mg by mouth Daily.      No current facility-administered medications  for this visit.    Allergies:   Review of patient's allergies indicates no known allergies.    Social History:  The patient  reports that she has quit smoking. Her smoking use included Cigarettes. She has never used smokeless tobacco. She reports that she drinks about 0.6 oz of alcohol per week. She reports that she does not use illicit drugs.   Family History:  The patient's family history includes Cancer in her mother; Hypertension in her sister; Other in her sister.    ROS:   Please see the history of present illness.   ROS   PHYSICAL EXAM: VS:  There were no vitals taken for this visit.    Wt Readings from Last 3 Encounters:  05/17/14 220 lb (99.791 kg)  05/19/13 180 lb (81.647 kg)  01/04/12 228 lb 3.2 oz (103.511 kg)     GEN: Well nourished, well  developed, in no acute distress HEENT: normal Neck: no JVD, no carotid bruits, no masses Cardiac:  Normal S1/S2, RRR; no murmur ,  no rubs or gallops,  edema  Respiratory:  clear to auscultation bilaterally, no wheezing, rhonchi or rales. GI: soft, nontender, nondistended, + BS MS: no deformity or atrophy Skin: warm and dry  Neuro:  CNs II-XII intact, Strength and sensation are intact Psych: Normal affect   EKG:  EKG is ordered today.  It demonstrates:      Recent Labs: 05/11/2014: ALT 20; BUN 21; Creatinine 0.90; Hemoglobin 14.5; Platelets 214; Potassium 3.9; Sodium 138 05/17/2014: TSH 2.07    Lipid Panel No results found for: CHOL, TRIG, HDL, CHOLHDL, VLDL, LDLCALC, LDLDIRECT    ASSESSMENT AND PLAN:  PAF (paroxysmal atrial fibrillation) CHADS2-VASc=3.  Annual stroke risk is 4.3%.    Essential hypertension   OSA (obstructive sleep apnea)  I have encouraged her to get her sleep study.  We discussed the importance of treating OSA as it relates to atrial arrhythmias.    Hx of Subdural Hematoma   Current medicines are reviewed at length with the patient today.  Any concerns are as outlined above. The following changes have been made:      Labs/ tests ordered today include:  No orders of the defined types were placed in this encounter.    Disposition:   FU    Signed, Brynda RimScott Jahzaria Vary, PA-C, MHS 07/06/2014 9:36 PM    [facility] [address], [place], [place]  [address] Phone: [phone]; Fax: [phone]    This encounter was created in error - please disregard.

## 2014-07-07 ENCOUNTER — Encounter: Payer: Medicare Other | Admitting: Physician Assistant

## 2014-07-09 ENCOUNTER — Other Ambulatory Visit: Payer: Self-pay

## 2014-07-09 MED ORDER — DILTIAZEM HCL ER COATED BEADS 120 MG PO CP24: 120.0000 mg | ORAL_CAPSULE | Freq: Every day | ORAL | Status: DC

## 2014-08-02 ENCOUNTER — Encounter: Payer: Self-pay | Admitting: Physician Assistant

## 2014-08-02 ENCOUNTER — Ambulatory Visit (INDEPENDENT_AMBULATORY_CARE_PROVIDER_SITE_OTHER): Payer: Medicare Other | Admitting: Physician Assistant

## 2014-08-02 VITALS — BP 110/52 | HR 66 | Ht 61.0 in | Wt 226.0 lb

## 2014-08-02 DIAGNOSIS — I480 Paroxysmal atrial fibrillation: Secondary | ICD-10-CM | POA: Diagnosis not present

## 2014-08-02 DIAGNOSIS — Z8679 Personal history of other diseases of the circulatory system: Secondary | ICD-10-CM | POA: Diagnosis not present

## 2014-08-02 DIAGNOSIS — G4733 Obstructive sleep apnea (adult) (pediatric): Secondary | ICD-10-CM

## 2014-08-02 DIAGNOSIS — I10 Essential (primary) hypertension: Secondary | ICD-10-CM

## 2014-08-02 MED ORDER — DILTIAZEM HCL ER COATED BEADS 180 MG PO CP24: 180.0000 mg | ORAL_CAPSULE | Freq: Every day | ORAL | Status: DC

## 2014-08-02 NOTE — Patient Instructions (Signed)
Medication Instructions:  1. INCREASE DILTIAZEM 180 MG DAILY; NEW RX SENT IN TODAY  Labwork: NONE  Testing/Procedures: NONE  Follow-Up: YOU ARE BEING REFERRED TO A-FIB CLINIC WITH DR. ALLRED IN 6-8 WEEKS  Any Other Special Instructions Will Be Listed Below (If Applicable).

## 2014-08-02 NOTE — Progress Notes (Signed)
Cardiology Office Note   Date:  08/02/2014   ID:  Hannah [name], DOB 1940-12-06, MRN [id number]  PCP:  Hollice Espy, MD  Cardiologist:  Dr. Verdis Prime     Chief Complaint  Patient presents with  . Atrial Fibrillation     History of Present Illness: Hannah [name] is a 74 y.o. female with a hx of HTN, spinal stenosis with chronic pain, DJD, CKD, OSA, HL, remote subdural hematoma in 1987. She was seen by her PCP 05/11/14 for a UTI. She was noted to be in SVT with HR 140. She was sent to the ED. ECG demonstrated AFib. EDP started Cardizem and ASA.  CHADS2-VASc=3 (female, HTN, > 9 yo).  Evaluated by me and Dr. Verdis Prime 05/17/14. She was back in NSR. Given her hx of remote subdural hematoma, we felt that she should FU with neurosurgery to assess risk of oral anticoagulation. She was continued on ASA. Echo was obtained and this demonstrated normal LVF. Event monitor was obtained to assess AFib burden. This demonstrated NSR and paroxysmal atrial fibrillation with RVR. Atrial fibrillation occurred 17% of the time. She did have maximum heart rate of 177 bpm.  She wore the monitor for a total of 8 days. 05/17/2014: TSH 2.07.   She returns for FU. She is doing well. She did see neurosurgery. She was to get an MRI. This had to be rescheduled due to an illness. It sounds as though she needed to have the MRI prior to the neurosurgeon being able to make a recommendation regarding anticoagulation. She denies chest pain or significant shortness of breath. She denies orthopnea, PND or edema. She denies syncope.   Studies/Reports Reviewed Today: Echo 05/28/14 - Left ventricle: EF 55% to 60%. Wall motion was normal; Leftventricular diastolic function parameters were normal. - Mitral valve: Calcified annulus. - Left atrium: The atrium was mildly dilated. - Right atrium: The atrium was mildly dilated. - Atrial septum: There was increased thickness of the septum, consistent with  lipomatous hypertrophy. Impressions: Normal LV function; mild biatrial enlargment; trace MR and TR.  Myoview 04/2013 Low risk stress nuclear study with no ischemia. .LV Ejection Fraction: 58%. LV Wall Motion: NL LV Function; NL Wall Motion   Past Medical History  Diagnosis Date  . Hypertension   . HLD (hyperlipidemia)     no medication  . OSA (obstructive sleep apnea)     suspected - never tested  . Spinal stenosis     chronic pain  . DJD (degenerative joint disease)   . CKD (chronic kidney disease) stage 3, GFR 30-59 ml/min   . Colon polyps   . Diverticulosis   . Microscopic hematuria   . Subdural hematoma     1987 - treated at [facility] in [place]  . Hx of cardiovascular stress test     Myoview 3/15: No ischemia EF 58%, low risk  . Hx of echocardiogram     Echo 4/16: EF 55-60%, no RWMA, mild BAE, atrial lipomatous hypertrophy, trivial MR, trivial TR    Past Surgical History  Procedure Laterality Date  . Cholecystectomy       Current Outpatient Prescriptions  Medication Sig Dispense Refill  . ALPRAZolam (XANAX) 0.25 MG tablet Take 1 tablet by mouth At bedtime as needed for sleep.     Marland Kitchen aspirin 81 MG chewable tablet Chew 4 tablets (324 mg total) by mouth daily. 120 tablet 0  . buPROPion (WELLBUTRIN XL) 150 MG 24 hr tablet Take 150 mg by mouth daily.    Marland Kitchen  diltiazem (CARDIZEM CD) 180 MG 24 hr capsule Take 1 capsule (180 mg total) by mouth daily. 30 capsule 11  . escitalopram (LEXAPRO) 10 MG tablet Take 10 mg by mouth daily.    . hydrochlorothiazide (HYDRODIURIL) 25 MG tablet Take 12.5 mg by mouth Daily.      No current facility-administered medications for this visit.    Allergies:   Review of patient's allergies indicates no known allergies.    Social History:  The patient  reports that she has quit smoking. Her smoking use included Cigarettes. She has never used smokeless tobacco. She reports that she drinks about 0.6 oz of alcohol per week. She reports that she does  not use illicit drugs.   Family History:  The patient's family history includes Cancer in her mother; Diabetes in her sister; Heart attack in her father and mother; Hypertension in her sister; Other in her sister; Stroke in her sister.    ROS:   Please see the history of present illness.   Review of Systems  Constitution: Positive for decreased appetite.  Musculoskeletal: Positive for back pain and joint pain.  Neurological: Positive for dizziness.  All other systems reviewed and are negative.    PHYSICAL EXAM: VS:  BP 110/52 mmHg  Pulse 66  Ht  (1.549 m)  Wt 226 lb (102.513 kg)  BMI 42.72 kg/m2    Wt Readings from Last 3 Encounters:  08/02/14 226 lb (102.513 kg)  05/17/14 220 lb (99.791 kg)  05/19/13 180 lb (81.647 kg)     GEN: Well nourished, well developed, in no acute distress HEENT: normal Neck: no JVD, no masses Cardiac:  Normal S1/S2, RRR; no murmur ,  no rubs or gallops, no edema  Respiratory:  clear to auscultation bilaterally, no wheezing, rhonchi or rales. GI: soft, nontender, nondistended, + BS MS: no deformity or atrophy Skin: warm and dry  Neuro:  CNs II-XII intact, Strength and sensation are intact Psych: Normal affect   EKG:  EKG is ordered today.  It demonstrates:   NSR, HR 66, normal axis, no change from prior tracing   Recent Labs: 05/11/2014: ALT 20; BUN 21; Creatinine 0.90; Hemoglobin 14.5; Platelets 214; Potassium 3.9; Sodium 138 05/17/2014: TSH 2.07    Lipid Panel No results found for: CHOL, TRIG, HDL, CHOLHDL, VLDL, LDLCALC, LDLDIRECT    ASSESSMENT AND PLAN:  PAF (paroxysmal atrial fibrillation):  CHADS2-VASc=3. Annual stroke risk is 4.3%. She had evidence of PAF on her monitor (17% of the time). She would definitely benefit from anticoagulation. However, the patient needs to follow-up with neurosurgery. I will obtain notes from a neurosurgeon. I have encouraged her to get her MRI. She did have fairly rapid rates when in atrial  fibrillation. These were asymptomatic. I will increase her diltiazem to 180 mg daily. I will also refer her to Dr. Johney Frame in the Atrial Fibrillation Clinic for further recommendations if any.  If neurosurgery clears her for anticoagulation, it may be best to use Coumadin or Pradaxa (given there are reversal agents available for these drugs).  Essential hypertension: Controlled.   OSA (obstructive sleep apnea):   She wears CPAP nightly.   Hx of Subdural Hematoma:   FU with Neurosurgery as planned.    Current medicines are reviewed at length with the patient today.  Concerns regarding medicines are as outlined above.  The following changes have been made:    Increase Diltiazem to 180 mg QD   Labs/ tests ordered today include:  Orders Placed This  Encounter  Procedures  . EKG 12-Lead    Disposition:   FU with Dr. Hillis RangeJames Allred in the AFib Clinic in 6-8 weeks.  She can FU with Dr. Verdis PrimeHenry Smith or me after that.   Signed, Brynda RimScott Lydiah Pong, PA-C, MHS 08/02/2014 5:04 PM    [facility] [address], [place], [place]  [address] Phone: [phone]; Fax: [phone]

## 2014-08-23 ENCOUNTER — Other Ambulatory Visit: Payer: Self-pay | Admitting: Neurosurgery

## 2014-08-23 ENCOUNTER — Telehealth: Payer: Self-pay | Admitting: *Deleted

## 2014-08-23 DIAGNOSIS — I629 Nontraumatic intracranial hemorrhage, unspecified: Secondary | ICD-10-CM

## 2014-08-23 NOTE — Telephone Encounter (Signed)
Lmptcb to see if MRI with Neurosurgeon has been done yet.

## 2014-08-25 NOTE — Telephone Encounter (Signed)
S/w pt today and she states she could not go through with MRI a few weeks ago due to clostrophobia. Pt has been rsc for MRI to be done on 09/07/14 w/Dr. Lisbeth RenshawNeelesh Nundkumar. I stressed improtance of MRI to see if can start coumadin or other blood thinner.

## 2014-09-01 ENCOUNTER — Telehealth: Payer: Self-pay | Admitting: Physician Assistant

## 2014-09-01 NOTE — Telephone Encounter (Signed)
lmom for pt that Tereso NewcomerScott Weaver, PA was not the provider who ordered the MRI originally; that we only referred her to Neuro. I stated that Neurology is who ordered the MRI and she will need to call them to get order sent to [facility].

## 2014-09-01 NOTE — Telephone Encounter (Signed)
New Message      Pt calling stating that she needs an order for the MRI that Tereso NewcomerScott Weaver wanted [facility] to [facility] at [facility] sent to [facility] in [place] Fax: [phone]. She needs it sent as soon as possible so she can get the MRI done prior to her Consult appt w/ Dr. Johney FrameAllred on 09/06/14. Please call back and advise.

## 2014-09-06 ENCOUNTER — Ambulatory Visit (INDEPENDENT_AMBULATORY_CARE_PROVIDER_SITE_OTHER): Payer: Medicare Other | Admitting: Internal Medicine

## 2014-09-06 ENCOUNTER — Encounter: Payer: Self-pay | Admitting: Internal Medicine

## 2014-09-06 VITALS — BP 130/80 | HR 75 | Ht 61.5 in | Wt 230.4 lb

## 2014-09-06 DIAGNOSIS — I609 Nontraumatic subarachnoid hemorrhage, unspecified: Secondary | ICD-10-CM | POA: Insufficient documentation

## 2014-09-06 DIAGNOSIS — I10 Essential (primary) hypertension: Secondary | ICD-10-CM | POA: Insufficient documentation

## 2014-09-06 DIAGNOSIS — I480 Paroxysmal atrial fibrillation: Secondary | ICD-10-CM | POA: Diagnosis not present

## 2014-09-06 MED ORDER — CARTIA XT 180 MG PO CP24
180.0000 mg | ORAL_CAPSULE | Freq: Every day | ORAL | Status: DC
Start: 2014-09-06 — End: 2015-01-07

## 2014-09-06 NOTE — Progress Notes (Signed)
Primary Care Physician: Hollice EspyGATES,DONNA RUTH, MD Referring Physician: Jadene PieriniScott Weaver,PA/Dr. Verdis PrimeHenry Smith   Hannah [name] is a 74 y.o. female with a h/o HTN, spinal stenosis with chronic pain, DJD, CKD, OSA, HL, remote subdural hematoma in 1987. She was seen by her PCP 05/11/14 for a UTI. She was noted to be in SVT with HR 140. She was sent to the ED. ECG demonstrated AFib. EDP started Cardizem and ASA.  She has been most recently evaluated by Tereso NewcomerScott Weaver, PA and Dr. Katrinka BlazingSmith. She wore a monitor which revealed predominant rhythm, some episodes of afib with RVR, Cardizem was increased. She was referred to neurosurgery for h/o subdural hematoma and ability to take blood thinners. She was asked to have a MRI, which pt has delayed due to claustrophobia, but it is scheduled for this Friday and she is serious to go thru with it. She is currently taking a baby asa. She has no idea etiology of  initial bleed in 1987, treated in [place].  She is unaware of afib. Feels well.  Today, she denies symptoms of palpitations, chest pain, shortness of breath, orthopnea, PND, lower extremity edema, dizziness, presyncope, syncope, or neurologic sequela. The patient is tolerating medications without difficulties and is otherwise without complaint today.   Past Medical History  Diagnosis Date  . Hypertension   . HLD (hyperlipidemia)     no medication  . OSA (obstructive sleep apnea)     suspected - never tested  . Spinal stenosis     chronic pain  . DJD (degenerative joint disease)   . CKD (chronic kidney disease) stage 3, GFR 30-59 ml/min   . Colon polyps   . Diverticulosis   . Microscopic hematuria   . Subdural hematoma     1987 - treated at [facility] in [place]  . Hx of cardiovascular stress test     Myoview 3/15: No ischemia EF 58%, low risk  . Hx of echocardiogram     Echo 4/16: EF 55-60%, no RWMA, mild BAE, atrial lipomatous hypertrophy, trivial MR, trivial TR  . A-fib   . Intracranial hemorrhage    Past  Surgical History  Procedure Laterality Date  . Cholecystectomy      Current Outpatient Prescriptions  Medication Sig Dispense Refill  . ALPRAZolam (XANAX) 0.25 MG tablet Take 1 tablet by mouth At bedtime as needed for sleep.     Marland Kitchen. CARTIA XT 180 MG 24 hr capsule Take 1 tablet by mouth daily.    . hydrochlorothiazide (HYDRODIURIL) 25 MG tablet Take 12.5 mg by mouth Daily.     Marland Kitchen. aspirin 81 MG tablet Take 81 mg by mouth daily.     No current facility-administered medications for this visit.    No Known Allergies  History   Social History  . Marital Status: Divorced    Spouse Name: N/A  . Number of Children: N/A  . Years of Education: N/A   Occupational History  . Not on file.   Social History Main Topics  . Smoking status: Former Smoker    Types: Cigarettes  . Smokeless tobacco: Never Used  . Alcohol Use: 0.6 oz/week    1 Standard drinks or equivalent per week     Comment: twice a week  . Drug Use: No  . Sexual Activity: Not on file   Other Topics Concern  . Not on file   Social History Narrative    Family History  Problem Relation Age of Onset  . Cancer Mother   . Heart  attack Mother   . Heart disease Mother   . Hypertension Sister   . Other Sister     subdural hematoma - brother and sister died  . Heart attack Father   . Suicidality Father   . Stroke Sister   . Diabetes Sister     ROS- All systems are reviewed and negative except as per the HPI above  Physical Exam: Filed Vitals:   09/06/14 0841 09/06/14 1050  BP: 148/84 130/80  Pulse: 75   Height: 5' 1.5" (1.562 m)   Weight: 230 lb 6.4 oz (104.509 kg)     GEN- The patient is well appearing, alert and oriented x 3 today.   Head- normocephalic, atraumatic Eyes-  Sclera clear, conjunctiva pink Ears- hearing intact Oropharynx- clear Neck- supple, no JVP Lymph- no cervical lymphadenopathy Lungs- Clear to ausculation bilaterally, normal work of breathing Heart- Regular rate and rhythm, no murmurs,  rubs or gallops, PMI not laterally displaced GI- soft, NT, ND, + BS Extremities- no clubbing, cyanosis, or edema MS- no significant deformity or atrophy Skin- no rash or lesion Psych- euthymic mood, full affect Neuro- strength and sensation are intact  EKG-NSR, normal EKG 75 bpm, PR int 164 ms, QRS int 80 ms, QTc 437 ms.  Assessment and Plan:  1. PAF Currently asymptomatic.  Would therefore recommend current rate control going forward. Continue cardizem Continue asa for now  2. Chadsvasc score of 3 Awaiting MRI and Neurosurgeons thoughts if blood thinner would be acceptable in light of previous subdural henmatoma. MRI pending Friday. Given AVERROES data, I feel that Eliquis would be an appropriate anticoagulant choic.  (AVERROES trial showing Eliquis bleeding risk comparable to asa). Would start at the lower dose of 2.5 mg bid x 6 weeks then progressing to tfull dose of 5 mg bid.  Watchman discussed as an alternative once [facility] becomes [facility]. This is a very difficult situation.  A high level of decision making was required for this visit today.  3. HTN Initially mildly elevated Rechecked at 132/80  She will follow up in the afib clinic in 4 weeks for further management.

## 2014-09-06 NOTE — Progress Notes (Deleted)
Primary Care Physician: Hollice EspyGATES,Klarissa Mcilvain RUTH, MD Referring Physician: Jadene PieriniScott Weaver,PA/Dr. Verdis PrimeHenry Smith   Hannah [name] is a 74 y.o. female with a h/o HTN, spinal stenosis with chronic pain, DJD, CKD, OSA, HL, remote subdural hematoma in 1987. She was seen by her PCP 05/11/14 for a UTI. She was noted to be in SVT with HR 140. She was sent to the ED. ECG demonstrated AFib. EDP started Cardizem and ASA.  She has been most recently evaluated by Tereso NewcomerScott Weaver, PA and Dr. Katrinka BlazingSmith. She wore a monitor which revealed predominant rhythm, some episodes of afib with RVR, Cardizem was increased. She was referred to neurosurgery for h/o subdural hematoma and ability to take blood thinners. She was asked to have a MRI, which pt has delayed due to claustrophobia, but it is scheduled for this Friday and she is serious to go thru with it. She is currently taking a baby asa. She has no idea etiology of  initial bleed in 1987, treated in [place].  She is unaware of afib. Feels well.  Today, she denies symptoms of palpitations, chest pain, shortness of breath, orthopnea, PND, lower extremity edema, dizziness, presyncope, syncope, or neurologic sequela. The patient is tolerating medications without difficulties and is otherwise without complaint today.   Past Medical History  Diagnosis Date  . Hypertension   . HLD (hyperlipidemia)     no medication  . OSA (obstructive sleep apnea)     suspected - never tested  . Spinal stenosis     chronic pain  . DJD (degenerative joint disease)   . CKD (chronic kidney disease) stage 3, GFR 30-59 ml/min   . Colon polyps   . Diverticulosis   . Microscopic hematuria   . Subdural hematoma     1987 - treated at [facility] in [place]  . Hx of cardiovascular stress test     Myoview 3/15: No ischemia EF 58%, low risk  . Hx of echocardiogram     Echo 4/16: EF 55-60%, no RWMA, mild BAE, atrial lipomatous hypertrophy, trivial MR, trivial TR  . A-fib   . Intracranial hemorrhage    Past  Surgical History  Procedure Laterality Date  . Cholecystectomy      Current Outpatient Prescriptions  Medication Sig Dispense Refill  . ALPRAZolam (XANAX) 0.25 MG tablet Take 1 tablet by mouth At bedtime as needed for sleep.     Marland Kitchen. CARTIA XT 180 MG 24 hr capsule Take 1 tablet by mouth daily.    . hydrochlorothiazide (HYDRODIURIL) 25 MG tablet Take 12.5 mg by mouth Daily.     Marland Kitchen. aspirin 81 MG tablet Take 81 mg by mouth daily.     No current facility-administered medications for this visit.    No Known Allergies  History   Social History  . Marital Status: Divorced    Spouse Name: N/A  . Number of Children: N/A  . Years of Education: N/A   Occupational History  . Not on file.   Social History Main Topics  . Smoking status: Former Smoker    Types: Cigarettes  . Smokeless tobacco: Never Used  . Alcohol Use: 0.6 oz/week    1 Standard drinks or equivalent per week     Comment: twice a week  . Drug Use: No  . Sexual Activity: Not on file   Other Topics Concern  . Not on file   Social History Narrative    Family History  Problem Relation Age of Onset  . Cancer Mother   . Heart  attack Mother   . Heart disease Mother   . Hypertension Sister   . Other Sister     subdural hematoma - brother and sister died  . Heart attack Father   . Suicidality Father   . Stroke Sister   . Diabetes Sister     ROS- All systems are reviewed and negative except as per the HPI above  Physical Exam: Filed Vitals:   09/06/14 0841 09/06/14 1050  BP: 148/84 130/80  Pulse: 75   Height: 5' 1.5" (1.562 m)   Weight: 230 lb 6.4 oz (104.509 kg)     GEN- The patient is well appearing, alert and oriented x 3 today.   Head- normocephalic, atraumatic Eyes-  Sclera clear, conjunctiva pink Ears- hearing intact Oropharynx- clear Neck- supple, no JVP Lymph- no cervical lymphadenopathy Lungs- Clear to ausculation bilaterally, normal work of breathing Heart- Regular rate and rhythm, no murmurs,  rubs or gallops, PMI not laterally displaced GI- soft, NT, ND, + BS Extremities- no clubbing, cyanosis, or edema MS- no significant deformity or atrophy Skin- no rash or lesion Psych- euthymic mood, full affect Neuro- strength and sensation are intact  EKG-NSR, normal EKG 75 bpm, PR int 164 ms, QRS int 80 ms, QTc 437 ms.  Assessment and Plan:  1. PAF Currently asymptomatic Continue cardizem Continue asa for now  2. Chadsvasc score of 3 Awaiting MRI and Neurosurgeons thoughts if blood thinner would be acceptable in light of previous subdural henmatoma. MRI pending Friday. Dr. Johney Frame believes that if given approval to use blood thinners, then Eliquis would be an appropriate choice due to the Aveeroes trial showing Eliquis bleeding risk comparable to asa. He recommends starting at the lower dose of 2.5 mg bid x 6 weeks then progressing to tfull dose of 5 mg bid.   3. HTN Initially mildly elevated Rechecked at 132/80  She will follow up in the afib clinic in 4 weeks for further management.

## 2014-09-06 NOTE — Patient Instructions (Signed)
Medication Instructions: - no changes  Labwork: - none  Procedures/Testing: - none  Follow-Up: - Your physician recommends that you schedule a follow-up appointment in: 4 weeks with Rudi Cocoonna Carroll, NP in the A-Fib clinic.  Any Additional Special Instructions Will Be Listed Below (If Applicable). - none

## 2014-09-07 ENCOUNTER — Ambulatory Visit
Admission: RE | Admit: 2014-09-07 | Discharge: 2014-09-07 | Disposition: A | Discharge status: Home / Self Care | Payer: Self-pay | Source: Ambulatory Visit | Attending: Neurosurgery | Admitting: Neurosurgery

## 2014-09-07 DIAGNOSIS — I629 Nontraumatic intracranial hemorrhage, unspecified: Secondary | ICD-10-CM

## 2014-10-04 ENCOUNTER — Other Ambulatory Visit (HOSPITAL_COMMUNITY): Payer: Self-pay | Admitting: *Deleted

## 2014-10-04 ENCOUNTER — Ambulatory Visit (HOSPITAL_COMMUNITY)
Admission: RE | Admit: 2014-10-04 | Discharge: 2014-10-04 | Disposition: A | Discharge status: Home / Self Care | Payer: Medicare Other | Source: Ambulatory Visit | Attending: Nurse Practitioner | Admitting: Nurse Practitioner

## 2014-10-04 VITALS — BP 140/68 | HR 66 | Ht 61.0 in | Wt 231.4 lb

## 2014-10-04 DIAGNOSIS — I10 Essential (primary) hypertension: Secondary | ICD-10-CM | POA: Diagnosis not present

## 2014-10-04 DIAGNOSIS — I480 Paroxysmal atrial fibrillation: Secondary | ICD-10-CM | POA: Insufficient documentation

## 2014-10-04 DIAGNOSIS — Z87891 Personal history of nicotine dependence: Secondary | ICD-10-CM | POA: Insufficient documentation

## 2014-10-04 MED ORDER — DILTIAZEM HCL 30 MG PO TABS: ORAL_TABLET | ORAL | Status: DC

## 2014-10-04 MED ORDER — APIXABAN 5 MG PO TABS: 5.0000 mg | ORAL_TABLET | Freq: Two times a day (BID) | ORAL | Status: DC

## 2014-10-04 MED ORDER — APIXABAN 2.5 MG PO TABS: 2.5000 mg | ORAL_TABLET | Freq: Two times a day (BID) | ORAL | Status: DC

## 2014-10-04 NOTE — Progress Notes (Signed)
Patient ID: [name], female   DOB: 09/16/1940, 74 y.o.   MRN: [id number]  Primary Care Physician: Hollice Espy, MD Referring Physician: Jadene Pierini. Verdis Prime   Emerlyn Mehlhoff is a 74 y.o. female with a h/o HTN, spinal stenosis with chronic pain, DJD, CKD, OSA, HL, remote subdural hematoma in 1987. She was seen by her PCP 05/11/14 for a UTI. She was noted to be in SVT with HR 140. She was sent to the ED. ECG demonstrated AFib. EDP started Cardizem and ASA.  She was evaluated by Tereso Newcomer, PA and Dr. Katrinka Blazing. She wore a monitor which revealed predominant rhythm, some episodes of afib with RVR, Cardizem was increased. She was referred to neurosurgery for h/o subdural hematoma and ability to take blood thinners.   She was asked to have a MRI, which pt was suppose to have f/u with neurosurgeon.Delayed several times  due to claustrophobia, but she had it finally,but has not heard results. She was suppose to have a f/u with neurosurgeon, but due to rescheduling MRI, f/u was cancelled.   She is currently taking a baby asa. She has no idea etiology of  initial bleed in 1987, treated in [place].    She is aware of short bursts of afib now, but usually last less than 15 mins and she is not overly symptomatic. Afib burden is still low and  she is not wanting a new approach to rate control for now, but will RX 30 mg cardizem tabs if needed. She does consume high amounts caffeine and we did discuss cutting back.  Placed a call to neurosurgeons office and based on unremarkable results on MRI, we received message that there was no contraindication to blood thinners per MRI report.   Today, she denies symptoms of palpitations, chest pain, shortness of breath, orthopnea, PND, lower extremity edema, dizziness, presyncope, syncope, or neurologic sequela. The patient is tolerating medications without difficulties and is otherwise without complaint today.   Past Medical History  Diagnosis Date   . Hypertension   . HLD (hyperlipidemia)     no medication  . OSA (obstructive sleep apnea)     suspected - never tested  . Spinal stenosis     chronic pain  . DJD (degenerative joint disease)   . CKD (chronic kidney disease) stage 3, GFR 30-59 ml/min   . Colon polyps   . Diverticulosis   . Microscopic hematuria   . Subdural hematoma     1987 - treated at [facility] in [place]  . Hx of cardiovascular stress test     Myoview 3/15: No ischemia EF 58%, low risk  . Hx of echocardiogram     Echo 4/16: EF 55-60%, no RWMA, mild BAE, atrial lipomatous hypertrophy, trivial MR, trivial TR  . A-fib   . Intracranial hemorrhage    Past Surgical History  Procedure Laterality Date  . Cholecystectomy      Current Outpatient Prescriptions  Medication Sig Dispense Refill  . ALPRAZolam (XANAX) 0.25 MG tablet Take 1 tablet by mouth At bedtime as needed for sleep.     Marland Kitchen CARTIA XT 180 MG 24 hr capsule Take 1 tablet by mouth daily.    . hydrochlorothiazide (HYDRODIURIL) 25 MG tablet Take 12.5 mg by mouth Daily.     Marland Kitchen aspirin 81 MG tablet Take 81 mg by mouth daily.     No current facility-administered medications for this visit.    No Known Allergies  History   Social History  . Marital Status:  Divorced    Spouse Name: N/A  . Number of Children: N/A  . Years of Education: N/A   Occupational History  . Not on file.   Social History Main Topics  . Smoking status: Former Smoker    Types: Cigarettes  . Smokeless tobacco: Never Used  . Alcohol Use: 0.6 oz/week    1 Standard drinks or equivalent per week     Comment: twice a week  . Drug Use: No  . Sexual Activity: Not on file   Other Topics Concern  . Not on file   Social History Narrative    Family History  Problem Relation Age of Onset  . Cancer Mother   . Heart attack Mother   . Heart disease Mother   . Hypertension Sister   . Other Sister     subdural hematoma - brother and sister died  . Heart attack Father   .  Suicidality Father   . Stroke Sister   . Diabetes Sister     ROS- All systems are reviewed and negative except as per the HPI above  Physical Exam: Filed Vitals:   09/06/14 0841 09/06/14 1050  BP: 148/84 130/80  Pulse: 75   Height: 5' 1.5" (1.562 m)   Weight: 230 lb 6.4 oz (104.509 kg)     GEN- The patient is well appearing, alert and oriented x 3 today.   Head- normocephalic, atraumatic Eyes-  Sclera clear, conjunctiva pink Ears- hearing intact Oropharynx- clear Neck- supple, no JVP Lymph- no cervical lymphadenopathy Lungs- Clear to ausculation bilaterally, normal work of breathing Heart- Regular rate and rhythm, no murmurs, rubs or gallops, PMI not laterally displaced GI- soft, NT, ND, + BS Extremities- no clubbing, cyanosis, or edema MS- no significant deformity or atrophy Skin- no rash or lesion Psych- euthymic mood, full affect Neuro- strength and sensation are intact  EKG-none today. In regular rhythm.  MRA HEAD FINDINGS  Both vertebral arteries patent to the basilar. Left vertebral artery is dominant. PICA patent bilaterally. Basilar widely patent. Superior cerebellar and posterior cerebral arteries patent without significant stenosis.  Internal carotid artery patent bilaterally without significant stenosis. Anterior and middle cerebral arteries patent bilaterally without stenosis  Negative for cerebral aneurysm. IMPRESSION: Mild chronic microvascular ischemic change in the cerebral white matter. No acute infarct. No evidence of acute or chronic hemorrhage in the brain.  Negative MRA head.    Assessment and Plan:  1. PAF Currently asymptomatic.  Would therefore recommend current rate control going forward. Continue daily cardizem 30 mg cardizem if needed for sustained afib. Reduce caffeine, alcohol use.  2. Chadsvasc score of 3 Per  Dr. Conchita Paris review of MRI, received word from his office that are no contraindications to start blood  thinners.  Given AVERROES data,Eliquis would be an appropriate anticoagulant choic.  (AVERROES trial showing Eliquis bleeding risk comparable to asa). Per Dr. Jenel Lucks previous recommendation, would start at the lower dose of 2.5 mg bid x 6 weeks then progressing to full dose of 5 mg bid.   Stop asa. Pt aware of bleeding risks and s/s to report.  3. HTN Stable  She will follow up in the afib clinic in 4 .  Elvina Sidle [facility] [address] [place], [place] [address] [phone]

## 2014-10-04 NOTE — Patient Instructions (Signed)
Your physician has recommended you make the following change in your medication:  1)Stop aspirin 2)Eliquis 2.5mg  twice a day. (Do not start  that prescription was written for until after appointment with donna) 3)Cardizem  -- take 1 tablet every 4 hours AS NEEDED for afib HR>100 and BP>100  Parking code for September 0090

## 2014-11-04 ENCOUNTER — Encounter (HOSPITAL_COMMUNITY): Payer: Self-pay | Admitting: Nurse Practitioner

## 2014-11-04 ENCOUNTER — Ambulatory Visit (HOSPITAL_COMMUNITY)
Admission: RE | Admit: 2014-11-04 | Discharge: 2014-11-04 | Disposition: A | Discharge status: Home / Self Care | Payer: Medicare Other | Source: Ambulatory Visit | Attending: Nurse Practitioner | Admitting: Nurse Practitioner

## 2014-11-04 ENCOUNTER — Telehealth (HOSPITAL_COMMUNITY): Payer: Self-pay | Admitting: *Deleted

## 2014-11-04 VITALS — BP 110/70 | HR 78 | Ht 61.0 in | Wt 225.4 lb

## 2014-11-04 DIAGNOSIS — I480 Paroxysmal atrial fibrillation: Secondary | ICD-10-CM

## 2014-11-04 MED ORDER — APIXABAN 5 MG PO TABS: 5.0000 mg | ORAL_TABLET | Freq: Two times a day (BID) | ORAL | Status: DC

## 2014-11-04 NOTE — Patient Instructions (Signed)
Continue eliquis 2.5mg  twice a day for 3 more weeks.  Then increase eliquis to  twice a day (If you have 2.5mg  tablets be sure you are taking 2 twice daily until you have  tablets)  Recheck in one month -- parking code 0900

## 2014-11-04 NOTE — Telephone Encounter (Signed)
PA for eliquis approved for 1 year pa [id number]

## 2014-11-04 NOTE — Progress Notes (Signed)
Patient ID: Hannah [name], female   DOB: [birth year], 74 y.o.   MRN: [id number]     Primary Care Physician: Hollice Espy, MD Referring Physician: Dr. Noemi Chapel Romo is a 74 y.o. female with a h/o HTN, spinal stenosis with chronic pain, DJD, CKD, OSA, HL, remote subdural hematoma in 1987. She was seen by her PCP 05/11/14 for a UTI. She was noted to be in SVT with HR 140. She was sent to the ED. ECG demonstrated AFib. EDP started Cardizem and ASA.  She was evaluated by Tereso Newcomer, PA and Dr. Katrinka Blazing. She wore a monitor which revealed predominant rhythm, some episodes of afib with RVR, Cardizem was increased. She was referred to neurosurgery for h/o subdural hematoma and ability to take blood thinners. She was asked to have a MRI, which pt has delayed due to claustrophobia,but it was performed and negative and got approval form neurosurgeon to use blood thinners.She is currently taking a baby asa. She has no idea etiology of initial bleed in 1987, treated in [place]. She is unaware of afib. Feels well.  She was seen in afib clinic to start blood thinners one month ago. Per Dr. Jenel Lucks recommendation, eliquis 2.5 mg bid was to started x 6 weeks and if tolerated increase to 5 mg bid. ASA stopped. She states today that hse misunderstood and has only been taking one a day. But she has not had any adverse effects so far and no awareness of afib.   Today, she denies symptoms of palpitations, chest pain, shortness of breath, orthopnea, PND, lower extremity edema, dizziness, presyncope, syncope, or neurologic sequela. The patient is tolerating medications without difficulties and is otherwise without complaint today.   Past Medical History  Diagnosis Date  . Hypertension   . HLD (hyperlipidemia)     no medication  . OSA (obstructive sleep apnea)     suspected - never tested  . Spinal stenosis     chronic pain  . DJD (degenerative joint disease)   . CKD (chronic kidney disease)  stage 3, GFR 30-59 ml/min   . Colon polyps   . Diverticulosis   . Microscopic hematuria   . Subdural hematoma     1987 - treated at [facility] in [place]  . Hx of cardiovascular stress test     Myoview 3/15: No ischemia EF 58%, low risk  . Hx of echocardiogram     Echo 4/16: EF 55-60%, no RWMA, mild BAE, atrial lipomatous hypertrophy, trivial MR, trivial TR  . A-fib   . Intracranial hemorrhage    Past Surgical History  Procedure Laterality Date  . Cholecystectomy      Current Outpatient Prescriptions  Medication Sig Dispense Refill  . ALPRAZolam (XANAX) 0.25 MG tablet Take 1 tablet by mouth At bedtime as needed for sleep.     Marland Kitchen apixaban (ELIQUIS) 5 MG TABS tablet Take 1 tablet (5 mg total) by mouth 2 (two) times daily. 60 tablet 3  . CARTIA XT 180 MG 24 hr capsule Take 1 capsule (180 mg total) by mouth daily. 30 capsule 6  . diltiazem (CARDIZEM) 30 MG tablet 1 tablet every 4 hours AS NEEDED for afib HR>100 and BP>100 30 tablet 1  . hydrochlorothiazide (HYDRODIURIL) 25 MG tablet Take 12.5 mg by mouth Daily.      No current facility-administered medications for this encounter.    No Known Allergies  Social History   Social History  . Marital Status: Divorced    Spouse Name: N/A  .  Number of Children: N/A  . Years of Education: N/A   Occupational History  . Not on file.   Social History Main Topics  . Smoking status: Former Smoker    Types: Cigarettes  . Smokeless tobacco: Never Used  . Alcohol Use: 0.6 oz/week    1 Standard drinks or equivalent per week     Comment: twice a week  . Drug Use: No  . Sexual Activity: Not on file   Other Topics Concern  . Not on file   Social History Narrative    Family History  Problem Relation Age of Onset  . Cancer Mother   . Heart attack Mother   . Heart disease Mother   . Hypertension Sister   . Other Sister     subdural hematoma - brother and sister died  . Heart attack Father   . Suicidality Father   . Stroke Sister     . Diabetes Sister     ROS- All systems are reviewed and negative except as per the HPI above  Physical Exam: Filed Vitals:   11/04/14 1331  BP: 110/70  Pulse: 78  Height: 5\' 1"  (1.549 m)  Weight: 225 lb 6.4 oz (102.241 kg)    GEN- The patient is well appearing, alert and oriented x 3 today.   Head- normocephalic, atraumatic Eyes-  Sclera clear, conjunctiva pink Ears- hearing intact Oropharynx- clear Neck- supple, no JVP Lymph- no cervical lymphadenopathy Lungs- Clear to ausculation bilaterally, normal work of breathing Heart- Regular rate and rhythm, no murmurs, rubs or gallops, PMI not laterally displaced GI- soft, NT, ND, + BS Extremities- no clubbing, cyanosis, or edema MS- no significant deformity or atrophy Skin- no rash or lesion Psych- euthymic mood, full affect Neuro- strength and sensation are intact  EKG-NSR, rightward axsis Pr int 154 ms, QRS 78 ms, QTc 440 ms.  Assessment and Plan: 1. PAF asymptomatic No change in diltiazem   2. Chadsvasc score of 2 Take eliquis 2.5 mg bid x 3 weeks then take 5 mg bid  Will see back one month.  Elvina Sidle [facility] [address] [place], [place] [address] [phone]

## 2014-12-06 ENCOUNTER — Encounter (HOSPITAL_COMMUNITY): Payer: Self-pay | Admitting: Nurse Practitioner

## 2014-12-06 ENCOUNTER — Ambulatory Visit (HOSPITAL_COMMUNITY)
Admission: RE | Admit: 2014-12-06 | Discharge: 2014-12-06 | Disposition: A | Discharge status: Home / Self Care | Payer: Medicare Other | Source: Ambulatory Visit | Attending: Nurse Practitioner | Admitting: Nurse Practitioner

## 2014-12-06 VITALS — BP 120/72 | HR 129 | Ht 61.0 in | Wt 229.8 lb

## 2014-12-06 DIAGNOSIS — I480 Paroxysmal atrial fibrillation: Secondary | ICD-10-CM | POA: Insufficient documentation

## 2014-12-06 NOTE — Progress Notes (Signed)
Patient ID: Hannah [name], female   DOB: 08-01-1940, 74 y.o.   MRN: [id number]     Primary Care Physician: Hollice Espy, MD Referring Physician:Dr.Allred   Hannah [name] is a 74 y.o. female with a h/o PAF that was started on DOAC several months ago after being cleared for taking by neurosurgery with remote history of cerebral bleed. She returns today feeling well, not aware she is afib with RVR. She reports that she can never tell if she has it or not. She does have the 30 mg pill in pocket cardizem and will take when she gets home. No bleeding issues.  Today, she denies symptoms of palpitations, chest pain, shortness of breath, orthopnea, PND, lower extremity edema, dizziness, presyncope, syncope, or neurologic sequela. The patient is tolerating medications without difficulties and is otherwise without complaint today.   Past Medical History  Diagnosis Date  . Hypertension   . HLD (hyperlipidemia)     no medication  . OSA (obstructive sleep apnea)     suspected - never tested  . Spinal stenosis     chronic pain  . DJD (degenerative joint disease)   . CKD (chronic kidney disease) stage 3, GFR 30-59 ml/min   . Colon polyps   . Diverticulosis   . Microscopic hematuria   . Subdural hematoma (HCC)     1987 - treated at [facility] in [place]  . Hx of cardiovascular stress test     Myoview 3/15: No ischemia EF 58%, low risk  . Hx of echocardiogram     Echo 4/16: EF 55-60%, no RWMA, mild BAE, atrial lipomatous hypertrophy, trivial MR, trivial TR  . A-fib (HCC)   . Intracranial hemorrhage [facility])    Past Surgical History  Procedure Laterality Date  . Cholecystectomy      Current Outpatient Prescriptions  Medication Sig Dispense Refill  . ALPRAZolam (XANAX) 0.25 MG tablet Take 1 tablet by mouth At bedtime as needed for sleep.     Marland Kitchen apixaban (ELIQUIS) 5 MG TABS tablet Take 1 tablet (5 mg total) by mouth 2 (two) times daily. 60 tablet 3  . CARTIA XT 180 MG 24 hr capsule Take 1 capsule  (180 mg total) by mouth daily. 30 capsule 6  . diltiazem (CARDIZEM) 30 MG tablet 1 tablet every 4 hours AS NEEDED for afib HR>100 and BP>100 30 tablet 1  . hydrochlorothiazide (HYDRODIURIL) 25 MG tablet Take 12.5 mg by mouth Daily.      No current facility-administered medications for this encounter.    No Known Allergies  Social History   Social History  . Marital Status: Divorced    Spouse Name: N/A  . Number of Children: N/A  . Years of Education: N/A   Occupational History  . Not on file.   Social History Main Topics  . Smoking status: Former Smoker    Types: Cigarettes  . Smokeless tobacco: Never Used  . Alcohol Use: 0.6 oz/week    1 Standard drinks or equivalent per week     Comment: twice a week  . Drug Use: No  . Sexual Activity: Not on file   Other Topics Concern  . Not on file   Social History Narrative    Family History  Problem Relation Age of Onset  . Cancer Mother   . Heart attack Mother   . Heart disease Mother   . Hypertension Sister   . Other Sister     subdural hematoma - brother and sister died  . Heart attack  Father   . Suicidality Father   . Stroke Sister   . Diabetes Sister     ROS- All systems are reviewed and negative except as per the HPI above  Physical Exam: Filed Vitals:   12/06/14 1107  BP: 120/72  Pulse: 129  Height:  (1.549 m)  Weight: 229 lb 12.8 oz (104.237 kg)    GEN- The patient is well appearing, alert and oriented x 3 today.   Head- normocephalic, atraumatic Eyes-  Sclera clear, conjunctiva pink Ears- hearing intact Oropharynx- clear Neck- supple, no JVP Lymph- no cervical lymphadenopathy Lungs- Clear to ausculation bilaterally, normal work of breathing Heart- Regular rate and rhythm, no murmurs, rubs or gallops, PMI not laterally displaced GI- soft, NT, ND, + BS Extremities- no clubbing, cyanosis, or edema MS- no significant deformity or atrophy Skin- no rash or lesion Psych- euthymic mood, full  affect Neuro- strength and sensation are intact  EKG- Afib with RVR at 129 ms, qrs int 78 ms, qtc 480 ms   Assessment and Plan: 1. PAF In RVR today, pt unaware She will take 30 mg tablet when she arrives home and will check heart rate and BP later today Can repeat after 4 hours if needed  2. Chadsvasc score of at least 3 Continue apixaban Currently without issues with bleeding  Recheck in 3 months  Sooner if needed  Lupita Leash C. [facility] [address] [place], [place] [address] [phone]

## 2014-12-09 ENCOUNTER — Ambulatory Visit (HOSPITAL_COMMUNITY): Payer: Medicare Other | Admitting: Nurse Practitioner

## 2014-12-13 ENCOUNTER — Telehealth: Payer: Self-pay | Admitting: Physician Assistant

## 2014-12-13 NOTE — Telephone Encounter (Signed)
ROI faxed to [facility] records received back placed in chart prep bin.

## 2014-12-17 ENCOUNTER — Telehealth (HOSPITAL_COMMUNITY): Payer: Self-pay | Admitting: *Deleted

## 2014-12-17 NOTE — Telephone Encounter (Signed)
Pt called in stating she is out of town taking care of her sister and noticed on her fitbit it was registering 150-160s. She asked the RN visiting her sister to check her HR and it was ranging from 158-160.  Patient states it has been like this for approximally 3 days and explains why she has felt so tired.  Inquired what medication she had taken today -- she had taken her regular morning medications and about a hour ago she took a 30mg  cardizem and did not notice relief so she took another one of daily Cartia 180mg  about 30 minutes ago.  She says her HR is now 70-90 range.  Educated patient on correct usage of PRN cardizem versus the extended release cardizem.  Also instructed patient to keep log of her HR over next few days. If her heart rate continues to be consistently elevated to call back as her medications may need to be adjusted. Discussed reasons to go to ER if HR is consistently staying in 150-160 range and medications not bringing HR down. Patient verbalized understanding. Will call if further issues.

## 2015-01-07 ENCOUNTER — Other Ambulatory Visit (HOSPITAL_COMMUNITY): Payer: Self-pay | Admitting: *Deleted

## 2015-01-07 MED ORDER — CARTIA XT 180 MG PO CP24: 180.0000 mg | ORAL_CAPSULE | Freq: Every day | ORAL | Status: DC

## 2015-01-14 ENCOUNTER — Ambulatory Visit (HOSPITAL_COMMUNITY)
Admission: RE | Admit: 2015-01-14 | Discharge: 2015-01-14 | Disposition: A | Discharge status: Home / Self Care | Payer: Medicare Other | Source: Ambulatory Visit | Attending: Nurse Practitioner | Admitting: Nurse Practitioner

## 2015-01-14 ENCOUNTER — Telehealth (HOSPITAL_COMMUNITY): Payer: Self-pay | Admitting: *Deleted

## 2015-01-14 ENCOUNTER — Encounter (HOSPITAL_COMMUNITY): Payer: Self-pay | Admitting: Nurse Practitioner

## 2015-01-14 VITALS — BP 106/74 | HR 146 | Ht 63.0 in | Wt 229.4 lb

## 2015-01-14 DIAGNOSIS — I480 Paroxysmal atrial fibrillation: Secondary | ICD-10-CM | POA: Diagnosis not present

## 2015-01-14 DIAGNOSIS — I4892 Unspecified atrial flutter: Secondary | ICD-10-CM | POA: Diagnosis not present

## 2015-01-14 MED ORDER — CARTIA XT 180 MG PO CP24: 180.0000 mg | ORAL_CAPSULE | Freq: Two times a day (BID) | ORAL | Status: DC

## 2015-01-14 NOTE — Telephone Encounter (Signed)
Call from [facility] patient in for colonoscopy and HR consistently running afib 130-140 and physician wants her evaluated today.  Offered appointment for today at 1130. Patient is stable other than HR elevated according to report. Patient has been off eliquis for 2 days.  States she took her cardizem within the last 24 hours.

## 2015-01-14 NOTE — Addendum Note (Signed)
Encounter addended by: Newman Niponna C Lateia Fraser, NP on: 01/14/2015  2:37 PM<BR>     Documentation filed: Notes Section

## 2015-01-14 NOTE — Patient Instructions (Signed)
Your physician has recommended you make the following change in your medication:  1)Increase cardizem 180mg  to twice daily

## 2015-01-14 NOTE — Progress Notes (Addendum)
Patient ID: Hannah [name], female   DOB: 10-31-1940, 74 y.o.   MRN: [id number]     Primary Care Physician: Hollice Espy, MD Referring Physician: Dr. Noemi Chapel Nations is a 74 y.o. female with a h/o PAF, on cardizem and xarelto, here from referral from endoscopy unit with RVR noted with colonoscopy this am. Pt is smiling and is completely asymptomatic. States she feels fine. Her afib burden has been higher recently and she had increased cardizem to 180 mg bid, in the past, which usually  returns her heart rate to normal range. She will do this short term and then return to normal qd dose. She has been off her blood thinner now x 2 days and has been told by GI to return to use this evening at supper time. EKG appears to be a aflutter at 146 bpm. She did take 180 mg of cardizem this am prior to procedure.  Today, she denies symptoms of palpitations, chest pain, shortness of breath, orthopnea, PND, lower extremity edema, dizziness, presyncope, syncope, or neurologic sequela. The patient is tolerating medications without difficulties and is otherwise without complaint today.   Past Medical History  Diagnosis Date  . Hypertension   . HLD (hyperlipidemia)     no medication  . OSA (obstructive sleep apnea)     suspected - never tested  . Spinal stenosis     chronic pain  . DJD (degenerative joint disease)   . CKD (chronic kidney disease) stage 3, GFR 30-59 ml/min   . Colon polyps   . Diverticulosis   . Microscopic hematuria   . Subdural hematoma (HCC)     1987 - treated at [facility] in [place]  . Hx of cardiovascular stress test     Myoview 3/15: No ischemia EF 58%, low risk  . Hx of echocardiogram     Echo 4/16: EF 55-60%, no RWMA, mild BAE, atrial lipomatous hypertrophy, trivial MR, trivial TR  . A-fib (HCC)   . Intracranial hemorrhage [facility])    Past Surgical History  Procedure Laterality Date  . Cholecystectomy      Current Outpatient Prescriptions  Medication Sig Dispense  Refill  . ALPRAZolam (XANAX) 0.25 MG tablet Take 1 tablet by mouth At bedtime as needed for sleep.     Marland Kitchen CARTIA XT 180 MG 24 hr capsule Take 1 capsule (180 mg total) by mouth 2 (two) times daily. 45 capsule 6  . diltiazem (CARDIZEM) 30 MG tablet 1 tablet every 4 hours AS NEEDED for afib HR>100 and BP>100 30 tablet 1  . hydrochlorothiazide (HYDRODIURIL) 25 MG tablet Take 12.5 mg by mouth Daily.     Marland Kitchen apixaban (ELIQUIS) 5 MG TABS tablet Take 1 tablet (5 mg total) by mouth 2 (two) times daily. (Patient not taking: Reported on 01/14/2015) 60 tablet 3   No current facility-administered medications for this encounter.    No Known Allergies  Social History   Social History  . Marital Status: Divorced    Spouse Name: N/A  . Number of Children: N/A  . Years of Education: N/A   Occupational History  . Not on file.   Social History Main Topics  . Smoking status: Former Smoker    Types: Cigarettes  . Smokeless tobacco: Never Used  . Alcohol Use: 0.6 oz/week    1 Standard drinks or equivalent per week     Comment: twice a week  . Drug Use: No  . Sexual Activity: Not on file   Other Topics Concern  .  Not on file   Social History Narrative    Family History  Problem Relation Age of Onset  . Cancer Mother   . Heart attack Mother   . Heart disease Mother   . Hypertension Sister   . Other Sister     subdural hematoma - brother and sister died  . Heart attack Father   . Suicidality Father   . Stroke Sister   . Diabetes Sister     ROS- All systems are reviewed and negative except as per the HPI above  Physical Exam: Filed Vitals:   01/14/15 1155  Pulse: 146  Height: 5\' 3"  (1.6 m)  Weight: 229 lb 6.4 oz (104.055 kg)    GEN- The patient is well appearing, alert and oriented x 3 today.   Head- normocephalic, atraumatic Eyes-  Sclera clear, conjunctiva pink Ears- hearing intact Oropharynx- clear Neck- supple, no JVP Lymph- no cervical lymphadenopathy Lungs- Clear to  ausculation bilaterally, normal work of breathing Heart- Rapid, regular rate and rhythm, no murmurs, rubs or gallops, PMI not laterally displaced GI- soft, NT, ND, + BS Extremities- no clubbing, cyanosis, or edema MS- no significant deformity or atrophy Skin- no rash or lesion Psych- euthymic mood, full affect Neuro- strength and sensation are intact  EKG- Aflutter at 146 bpm, vrs sinus tach with short pr, pr int 96 bpm, qrs int 176 ms, qtc 545 int Epic records reviewed  Assessment and Plan: 1. Aflutter with rvr Did consider DCCV today, but is drinking coffee on arrival to  afib clinic and has been off blood thinners for 48+  hours. Also discussed start of amiodarone, pt not excited to start new meds, especially since she wants to stay with sister in [place] for the next 3 weeks. Take an additional 180 mg cardizem on arrival home and continue bid until seen on f/u on Monday.  Pt is wanting to leave after that appointment to stay with her sister in [place]. Restart eliquis 5 mg tonight at supper time. If develops any chest pain, shortness of breath or lightheadedness, proceed to the ER.   F/u Afib clinic Monday at 9:30 am.  Elvina SidleDonna C. Matthew Folksarroll, ANP-C Af[facility] [address] [place], [place] [id number] [phone]

## 2015-01-15 ENCOUNTER — Telehealth: Payer: Self-pay | Admitting: Cardiology

## 2015-01-15 NOTE — Telephone Encounter (Signed)
Patient called tonight complaining of fast heart beat.  She says that her heart rate was 140bpm tonight.  She was seen by Rudi Cocoonna Carroll on Friday and heart rate was increased so she was recommended to increase Cardizem to BID which she did but HR still elevated.  Her heart rate has come down to 120bpm after taking the Cardizem about 2 hours ago.  She does not want to go to the ER.  She feels fine and is asymptomatic.  I told her to keep a check on the HR and if it continues to decrease then keep on Cardizem BID and followup on Monday as scheuduled with Rudi Cocoonna Carroll, o/w if HR increases past what it is now she needs to go to the ER.

## 2015-01-17 ENCOUNTER — Ambulatory Visit (HOSPITAL_COMMUNITY)
Admission: RE | Admit: 2015-01-17 | Discharge: 2015-01-17 | Disposition: A | Discharge status: Home / Self Care | Payer: Medicare Other | Source: Ambulatory Visit | Attending: Nurse Practitioner | Admitting: Nurse Practitioner

## 2015-01-17 VITALS — BP 122/88 | HR 143 | Ht 61.0 in | Wt 231.8 lb

## 2015-01-17 DIAGNOSIS — I483 Typical atrial flutter: Secondary | ICD-10-CM | POA: Diagnosis not present

## 2015-01-17 DIAGNOSIS — I4892 Unspecified atrial flutter: Secondary | ICD-10-CM | POA: Insufficient documentation

## 2015-01-17 LAB — CBC
HEMATOCRIT: 43.8 % (ref 36.0–46.0)
Hemoglobin: 14.9 g/dL (ref 12.0–15.0)
MCH: 30.7 pg (ref 26.0–34.0)
MCHC: 34 g/dL (ref 30.0–36.0)
MCV: 90.3 fL (ref 78.0–100.0)
PLATELETS: 266 10*3/uL (ref 150–400)
RBC: 4.85 MIL/uL (ref 3.87–5.11)
RDW: 13.8 % (ref 11.5–15.5)
WBC: 8 10*3/uL (ref 4.0–10.5)

## 2015-01-17 LAB — BASIC METABOLIC PANEL
Anion gap: 10 (ref 5–15)
BUN: 11 mg/dL (ref 6–20)
CALCIUM: 10.1 mg/dL (ref 8.9–10.3)
CO2: 30 mmol/L (ref 22–32)
CREATININE: 1 mg/dL (ref 0.44–1.00)
Chloride: 103 mmol/L (ref 101–111)
GFR calc non Af Amer: 55 mL/min — ABNORMAL LOW (ref >60)
GLUCOSE: 103 mg/dL — AB (ref 65–99)
Potassium: 4.3 mmol/L (ref 3.5–5.1)
Sodium: 143 mmol/L (ref 135–145)

## 2015-01-17 NOTE — Patient Instructions (Signed)
Cardioversion scheduled for Tuesday, November 22nd  - Arrive at the [facility] and go to admitting at [facility]1PM  -Do not eat or drink anything after midnight the night prior to your procedure.  - Take all your medication with a sip of water prior to arrival.  - You will not be able to drive home after your procedure.

## 2015-01-17 NOTE — Progress Notes (Signed)
Patient ID: Hannah [name], female   DOB: June 07, 1940, 74 y.o.   MRN: [id number]     Primary Care Physician: Hollice Espy, MD Referring Physician: Dr. Noemi Chapel Haydon is a 74 y.o. female with a h/o afb/flutter that presented for a colonoscopy on Friday 11/28, and was in aflutter with rapid RVR, asymptomatic.She had the colonoscopy without issues and was referred to the afib clinic after the procedure. EKG showed continuation of aflutter with rvr with pt still being asymptomatic. She did not want to got to the ER. She had been off anticoagulation  48 hours prior to procedure and restarted DOAC at dinnertime Friday PM and has now had 6 doses of eliquis.   She presents today continues in the  rapid rate, despite increasing Cardizem to 180 mg bid. Is still asymptomatic. She is anxious to return to the [place] area to stay with her sister, whose health is not good. Discussed DCCV, with TEE, due to interruption of blood thinners, and she is in agreement. Tried to schedule for today, but no spots for anesthesia are available. Will be scheduled for tomorrow.   Today, she denies symptoms of palpitations, chest pain, shortness of breath, orthopnea, PND, lower extremity edema, dizziness, presyncope, syncope, or neurologic sequela. The patient is tolerating medications without difficulties and is otherwise without complaint today.   Past Medical History  Diagnosis Date  . Hypertension   . HLD (hyperlipidemia)     no medication  . OSA (obstructive sleep apnea)     suspected - never tested  . Spinal stenosis     chronic pain  . DJD (degenerative joint disease)   . CKD (chronic kidney disease) stage 3, GFR 30-59 ml/min   . Colon polyps   . Diverticulosis   . Microscopic hematuria   . Subdural hematoma (HCC)     1987 - treated at [facility] in [place]  . Hx of cardiovascular stress test     Myoview 3/15: No ischemia EF 58%, low risk  . Hx of echocardiogram     Echo 4/16: EF 55-60%, no RWMA,  mild BAE, atrial lipomatous hypertrophy, trivial MR, trivial TR  . A-fib (HCC)   . Intracranial hemorrhage [facility])    Past Surgical History  Procedure Laterality Date  . Cholecystectomy      Current Outpatient Prescriptions  Medication Sig Dispense Refill  . ALPRAZolam (XANAX) 0.25 MG tablet Take 1 tablet by mouth At bedtime as needed for sleep.     Marland Kitchen apixaban (ELIQUIS) 5 MG TABS tablet Take 1 tablet (5 mg total) by mouth 2 (two) times daily. 60 tablet 3  . CARTIA XT 180 MG 24 hr capsule Take 1 capsule (180 mg total) by mouth 2 (two) times daily. 45 capsule 6  . diltiazem (CARDIZEM) 30 MG tablet 1 tablet every 4 hours AS NEEDED for afib HR>100 and BP>100 30 tablet 1  . hydrochlorothiazide (HYDRODIURIL) 25 MG tablet Take 12.5 mg by mouth Daily.      No current facility-administered medications for this encounter.    No Known Allergies  Social History   Social History  . Marital Status: Divorced    Spouse Name: N/A  . Number of Children: N/A  . Years of Education: N/A   Occupational History  . Not on file.   Social History Main Topics  . Smoking status: Former Smoker    Types: Cigarettes  . Smokeless tobacco: Never Used  . Alcohol Use: 0.6 oz/week    1 Standard drinks or  equivalent per week     Comment: twice a week  . Drug Use: No  . Sexual Activity: Not on file   Other Topics Concern  . Not on file   Social History Narrative    Family History  Problem Relation Age of Onset  . Cancer Mother   . Heart attack Mother   . Heart disease Mother   . Hypertension Sister   . Other Sister     subdural hematoma - brother and sister died  . Heart attack Father   . Suicidality Father   . Stroke Sister   . Diabetes Sister     ROS- All systems are reviewed and negative except as per the HPI above  Physical Exam: Filed Vitals:   01/17/15 0934  BP: 122/88  Pulse: 143  Height: 5\' 1"  (1.549 m)  Weight: 231 lb 12.8 oz (105.144 kg)    GEN- The patient is well  appearing, alert and oriented x 3 today.   Head- normocephalic, atraumatic Eyes-  Sclera clear, conjunctiva pink Ears- hearing intact Oropharynx- clear Neck- supple, no JVP Lymph- no cervical lymphadenopathy Lungs- Clear to ausculation bilaterally, normal work of breathing Heart- Rapid regular rate and rhythm, no murmurs, rubs or gallops, PMI not laterally displaced GI- soft, NT, ND, + BS Extremities- no clubbing, cyanosis, or edema MS- no significant deformity or atrophy Skin- no rash or lesion Psych- euthymic mood, full affect Neuro- strength and sensation are intact  EKG- Aflutter with v rate of 143 bpm, qrs int 188 ms, qtc, 546 ms, RBBB.  Assessment and Plan: 1.Persistent asypmtomatic aflutter Pt has preferred not to go to ER. Will schedule  for TEE/DCCV for tomorrow at 2pm, with TEE, due to interruption of DOAC for 48 hours due to  colonoscopy on Friday. IF rhythm persists despite DCCV, antiarrhythmics will be needed. At present, pt would like to avoid antiarrythmic's, due to wanting to go to [place] area as soon as possible. Continue diltiazem 180 mg bid.  Continue eliquis. Bmet/cbc today

## 2015-01-18 ENCOUNTER — Encounter (HOSPITAL_COMMUNITY)
Admission: RE | Disposition: A | Discharge status: Home / Self Care | Payer: Self-pay | Source: Ambulatory Visit | Attending: Internal Medicine

## 2015-01-18 ENCOUNTER — Ambulatory Visit (HOSPITAL_BASED_OUTPATIENT_CLINIC_OR_DEPARTMENT_OTHER)
Admission: RE | Admit: 2015-01-18 | Discharge: 2015-01-18 | Disposition: A | Discharge status: Home / Self Care | Payer: Medicare Other | Source: Ambulatory Visit | Admitting: Internal Medicine

## 2015-01-18 ENCOUNTER — Ambulatory Visit (HOSPITAL_COMMUNITY)
Admission: RE | Admit: 2015-01-18 | Discharge: 2015-01-18 | Disposition: A | Discharge status: Home / Self Care | Payer: Medicare Other | Source: Ambulatory Visit | Attending: Internal Medicine | Admitting: Internal Medicine

## 2015-01-18 ENCOUNTER — Ambulatory Visit (HOSPITAL_COMMUNITY): Payer: Medicare Other | Admitting: Certified Registered Nurse Anesthetist

## 2015-01-18 ENCOUNTER — Encounter (HOSPITAL_COMMUNITY): Payer: Self-pay | Admitting: Certified Registered Nurse Anesthetist

## 2015-01-18 DIAGNOSIS — I4892 Unspecified atrial flutter: Secondary | ICD-10-CM | POA: Insufficient documentation

## 2015-01-18 DIAGNOSIS — I483 Typical atrial flutter: Secondary | ICD-10-CM | POA: Diagnosis not present

## 2015-01-18 DIAGNOSIS — Z87891 Personal history of nicotine dependence: Secondary | ICD-10-CM | POA: Insufficient documentation

## 2015-01-18 DIAGNOSIS — I253 Aneurysm of heart: Secondary | ICD-10-CM | POA: Diagnosis not present

## 2015-01-18 DIAGNOSIS — I43 Cardiomyopathy in diseases classified elsewhere: Secondary | ICD-10-CM | POA: Diagnosis present

## 2015-01-18 DIAGNOSIS — M1990 Unspecified osteoarthritis, unspecified site: Secondary | ICD-10-CM | POA: Insufficient documentation

## 2015-01-18 DIAGNOSIS — I081 Rheumatic disorders of both mitral and tricuspid valves: Secondary | ICD-10-CM | POA: Diagnosis not present

## 2015-01-18 DIAGNOSIS — I10 Essential (primary) hypertension: Secondary | ICD-10-CM | POA: Insufficient documentation

## 2015-01-18 DIAGNOSIS — I428 Other cardiomyopathies: Secondary | ICD-10-CM | POA: Diagnosis present

## 2015-01-18 DIAGNOSIS — R000 Tachycardia, unspecified: Secondary | ICD-10-CM

## 2015-01-18 HISTORY — PX: TEE WITHOUT CARDIOVERSION: SHX5443

## 2015-01-18 HISTORY — PX: CARDIOVERSION: SHX1299

## 2015-01-18 SURGERY — ECHOCARDIOGRAM, TRANSESOPHAGEAL
Anesthesia: Monitor Anesthesia Care

## 2015-01-18 MED ORDER — PROPOFOL 500 MG/50ML IV EMUL
INTRAVENOUS | Status: DC | PRN
  Administered 2015-01-18: 100 ug/kg/min via INTRAVENOUS

## 2015-01-18 MED ORDER — LIDOCAINE VISCOUS 2 % MT SOLN
OROMUCOSAL | Status: DC | PRN
  Administered 2015-01-18: 5 mL via OROMUCOSAL

## 2015-01-18 MED ORDER — SODIUM CHLORIDE 0.9 % IV SOLN: INTRAVENOUS | Status: DC | PRN

## 2015-01-18 MED ORDER — LIDOCAINE VISCOUS 2 % MT SOLN
OROMUCOSAL | Status: AC
  Filled 2015-01-18: qty 15

## 2015-01-18 MED ORDER — BUTAMBEN-TETRACAINE-BENZOCAINE 2-2-14 % EX AERO
INHALATION_SPRAY | CUTANEOUS | Status: DC | PRN
  Administered 2015-01-18: 1 via TOPICAL

## 2015-01-18 MED ORDER — LACTATED RINGERS IV SOLN
INTRAVENOUS | Status: DC
  Administered 2015-01-18: 14:00:00 via INTRAVENOUS

## 2015-01-18 MED ORDER — LACTATED RINGERS IV SOLN
INTRAVENOUS | Status: DC | PRN
  Administered 2015-01-18: 14:00:00 via INTRAVENOUS

## 2015-01-18 MED ORDER — SODIUM CHLORIDE 0.9 % IV SOLN: INTRAVENOUS | Status: DC

## 2015-01-18 NOTE — Anesthesia Postprocedure Evaluation (Signed)
Anesthesia Post Note  Patient: Cleora FleetNancy Buras  Procedure(s) Performed: Procedure(s) (LRB): TRANSESOPHAGEAL ECHOCARDIOGRAM (TEE) (N/A) CARDIOVERSION (N/A)  Patient location during evaluation: PACU Anesthesia Type: MAC Level of consciousness: awake and alert Pain management: pain level controlled Vital Signs Assessment: post-procedure vital signs reviewed and stable Respiratory status: spontaneous breathing, nonlabored ventilation and respiratory function stable Cardiovascular status: stable and blood pressure returned to baseline Anesthetic complications: no    Last Vitals:  Filed Vitals:   01/18/15 1510 01/18/15 1520  BP: 102/62 120/70  Pulse: 72 79  Temp:    Resp: 15 20    Last Pain: There were no vitals filed for this visit.               Cathalina Barcia,W. EDMOND

## 2015-01-18 NOTE — Transfer of Care (Signed)
Immediate Anesthesia Transfer of Care Note  Patient: Hannah [name]  Procedure(s) Performed: Procedure(s): TRANSESOPHAGEAL ECHOCARDIOGRAM (TEE) (N/A) CARDIOVERSION (N/A)  Patient Location: Endoscopy Unit  Anesthesia Type:MAC  Level of Consciousness: awake, alert  and oriented  Airway & Oxygen Therapy: Patient Spontanous Breathing and Patient connected to nasal cannula oxygen  Post-op Assessment: Report given to RN, Post -op Vital signs reviewed and stable and Patient moving all extremities X 4  Post vital signs: Reviewed and stable  Last Vitals:  Filed Vitals:   01/18/15 1322  BP: 124/69  Pulse: 141  Temp: 36.7 C  Resp: 21    Complications: No apparent anesthesia complications

## 2015-01-18 NOTE — Anesthesia Preprocedure Evaluation (Addendum)
Anesthesia Evaluation  Patient identified by MRN, date of birth, ID band Patient awake    Reviewed: Allergy & Precautions, H&P , NPO status , Patient's Chart, lab work & pertinent test results  Airway Mallampati: II  TM Distance: >3 FB Neck ROM: Full    Dental no notable dental hx. (+) Dental Advisory Given, Upper Dentures, Lower Dentures   Pulmonary sleep apnea and Continuous Positive Airway Pressure Ventilation , former smoker,    Pulmonary exam normal breath sounds clear to auscultation       Cardiovascular hypertension, Pt. on medications + dysrhythmias Atrial Fibrillation  Rhythm:Irregular Rate:Tachycardia     Neuro/Psych negative neurological ROS  negative psych ROS   GI/Hepatic negative GI ROS, Neg liver ROS,   Endo/Other  Morbid obesity  Renal/GU Renal disease  negative genitourinary   Musculoskeletal  (+) Arthritis , Osteoarthritis,    Abdominal   Peds  Hematology negative hematology ROS (+)   Anesthesia Other Findings   Reproductive/Obstetrics negative OB ROS                           Anesthesia Physical Anesthesia Plan  ASA: III  Anesthesia Plan: MAC   Post-op Pain Management:    Induction: Intravenous  Airway Management Planned: Nasal Cannula  Additional Equipment:   Intra-op Plan:   Post-operative Plan:   Informed Consent: I have reviewed the patients History and Physical, chart, labs and discussed the procedure including the risks, benefits and alternatives for the proposed anesthesia with the patient or authorized representative who has indicated his/her understanding and acceptance.   Dental advisory given  Plan Discussed with: CRNA  Anesthesia Plan Comments:         Anesthesia Quick Evaluation

## 2015-01-18 NOTE — H&P (Signed)
     INTERVAL PROCEDURE H&P  History and Physical Interval Note:  01/18/2015 2:15 PM  Hannah [name] has presented today for their planned procedure. The various methods of treatment have been discussed with the patient and family. After consideration of risks, benefits and other options for treatment, the patient has consented to the procedure.  The patients' outpatient history has been reviewed, patient examined, and no change in status from most recent office note within the past 30 days. I have reviewed the patients' chart and labs and will proceed as planned. Questions were answered to the patient's satisfaction.   Chrystie NoseKenneth C. Barbi Kumagai, MD, [facility] Attending Cardiologist [facility]  Chrystie NoseKenneth C Kizer [name] 01/18/2015, 2:15 PM

## 2015-01-18 NOTE — Discharge Instructions (Signed)
Transesophageal Echocardiogram °Transesophageal echocardiography (TEE) is a picture test of your heart using sound waves. The pictures taken can give very detailed pictures of your heart. This can help your doctor see if there are problems with your heart. TEE can check: °· If your heart has blood clots in it. °· How well your heart valves are working. °· If you have an infection on the inside of your heart. °· Some of the major arteries of your heart. °· If your heart valve is working after a repair. °· Your heart before a procedure that uses a shock to your heart to get the rhythm back to normal. °BEFORE THE PROCEDURE °· Do not eat or drink for 6 hours before the procedure or as told by your doctor. °· Make plans to have someone drive you home after the procedure. Do not drive yourself home. °· An IV tube will be put in your arm. °PROCEDURE °· You will be given a medicine to help you relax (sedative). It will be given through the IV tube. °· A numbing medicine will be sprayed or gargled in the back of your throat to help numb it. °· The tip of the probe is placed into the back of your mouth. You will be asked to swallow. This helps to pass the probe into your esophagus. °· Once the tip of the probe is in the right place, your doctor can take pictures of your heart. °· You may feel pressure at the back of your throat. °AFTER THE PROCEDURE °· You will be taken to a recovery area so the sedative can wear off. °· Your throat may be sore and scratchy. This will go away slowly over time. °· You will go home when you are fully awake and able to swallow liquids. °· You should have someone stay with you for the next 24 hours. °· Do not drive or operate machinery for the next 24 hours. °  °This information is not intended to replace advice given to you by your health care provider. Make sure you discuss any questions you have with your health care provider. °  °Document Released: 12/10/2008 Document Revised: 02/17/2013  Document Reviewed: 08/14/2012 °Elsevier Interactive Patient Education ©2016 [employer] °Electrical Cardioversion, Care After °Refer to this sheet in the next few weeks. These instructions provide you with information on caring for yourself after your procedure. Your health care provider may also give you more specific instructions. Your treatment has been planned according to current medical practices, but problems sometimes occur. Call your health care provider if you have any problems or questions after your procedure. °WHAT TO EXPECT AFTER THE PROCEDURE °After your procedure, it is typical to have the following sensations: °· Some redness on the skin where the shocks were delivered. If this is tender, a sunburn lotion or hydrocortisone cream may help. °· Possible return of an abnormal heart rhythm within hours or days after the procedure. °HOME CARE INSTRUCTIONS °· Take medicines only as directed by your health care provider. Be sure you understand how and when to take your medicine. °· Learn how to feel your pulse and check it often. °· Limit your activity for 48 hours after the procedure or as directed by your health care provider. °· Avoid or minimize caffeine and other stimulants as directed by your health care provider. °SEEK MEDICAL CARE IF: °· You feel like your heart is beating too fast or your pulse is not regular. °· You have any questions about your medicines. °·   You have bleeding that will not stop. °SEEK IMMEDIATE MEDICAL CARE IF: °· You are dizzy or feel faint. °· It is hard to breathe or you feel short of breath. °· There is a change in discomfort in your chest. °· Your speech is slurred or you have trouble moving an arm or leg on one side of your body. °· You get a serious muscle cramp that does not go away. °· Your fingers or toes turn cold or blue. °  °This information is not intended to replace advice given to you by your health care provider. Make sure you discuss any questions you have  with your health care provider. °  °Document Released: 12/03/2012 Document Revised: 03/05/2014 Document Reviewed: 12/03/2012 °Elsevier Interactive Patient Education ©2016 [employer] Inc. ° °

## 2015-01-18 NOTE — Anesthesia Procedure Notes (Signed)
Procedure Name: MAC Date/Time: 01/18/2015 2:20 PM Performed by: Rise PatienceBELL, Genifer Lazenby T Pre-anesthesia Checklist: Patient identified, Emergency Drugs available, Suction available and Patient being monitored Patient Re-evaluated:Patient Re-evaluated prior to inductionOxygen Delivery Method: Nasal cannula Preoxygenation: Pre-oxygenation with 100% oxygen Intubation Type: IV induction Placement Confirmation: positive ETCO2 and breath sounds checked- equal and bilateral Dental Injury: Teeth and Oropharynx as per pre-operative assessment

## 2015-01-18 NOTE — Progress Notes (Signed)
Echocardiogram Echocardiogram Transesophageal has been performed.  Dorothey BasemanReel, Jadesola Poynter M 01/18/2015, 2:57 PM

## 2015-01-18 NOTE — CV Procedure (Signed)
TEE/CARDIOVERSION NOTE  TRANSESOPHAGEAL ECHOCARDIOGRAM (TEE):  Indictation: Atrial Flutter  Consent:   Informed consent was obtained prior to the procedure. The risks, benefits and alternatives for the procedure were discussed and the patient comprehended these risks.  Risks include, but are not limited to, cough, sore throat, vomiting, nausea, somnolence, esophageal and stomach trauma or perforation, bleeding, low blood pressure, aspiration, pneumonia, infection, trauma to the teeth and death.    Time Out: Verified patient identification, verified procedure, site/side was marked, verified correct patient position, special equipment/implants available, medications/allergies/relevent history reviewed, required imaging and test results available. Performed  Procedure:  After a procedural time-out, the patient was given propofol per anesthesia for sedation.  The oropharynx was anesthetized 10 cc of topical 1% viscous lidocaine and 1 cetacaine spray.  The transesophageal probe was inserted in the esophagus and stomach without difficulty and multiple views were obtained.  The patient was kept under observation until the patient left the procedure room.  The patient left the procedure room in stable condition.   Agitated microbubble saline contrast was administered.  Complications:    Complications: None Patient did tolerate procedure well.  Findings:  1. LEFT VENTRICLE: The left ventricular wall thickness is mildly increased.  The left ventricular cavity is dilated in size. Wall motion is globally hypokinetic.  LVEF is 35-40%.  2. RIGHT VENTRICLE:  The right ventricle is normal in structure and function without any thrombus or masses.    3. LEFT ATRIUM:  The left atrium is dilated in size without any thrombus or masses.  There is not spontaneous echo contrast ("smoke") in the left atrium consistent with a low flow state.  4. LEFT ATRIAL APPENDAGE:  The left atrial appendage is free  of any thrombus or masses. The appendage has single lobes. Pulse doppler indicates moderate flow in the appendage.  5. ATRIAL SEPTUM:  The atrial septum is aneurysmal, however, there is no evidence for interatrial shunting by color doppler and saline microbubble.  6. RIGHT ATRIUM:  The right atrium is normal in size and function without any thrombus or masses.  7. MITRAL VALVE:  The mitral valve is normal in structure and function with Mild regurgitation.  There were no vegetations or stenosis.  8. AORTIC VALVE:  The aortic valve is trileaflet, normal in structure and function with no regurgitation.  There were no vegetations or stenosis  9. TRICUSPID VALVE:  The tricuspid valve is normal in structure and function with trace to mild regurgitation.  There were no vegetations or stenosis  10.  PULMONIC VALVE:  The pulmonic valve is normal in structure and function with no regurgitation.  There were no vegetations or stenosis.   11. AORTIC ARCH, ASCENDING AND DESCENDING AORTA:  The aorta was not visualized.  12. PULMONARY VEINS: Anomalous pulmonary venous return was not noted.  13. PERICARDIUM: The pericardium appeared normal and non-thickened.  There is no pericardial effusion.  CARDIOVERSION:     Second Time Out: Verified patient identification, verified procedure, site/side was marked, verified correct patient position, special equipment/implants available, medications/allergies/relevent history reviewed, required imaging and test results available.  Performed  Procedure:  1. Patient placed on cardiac monitor, pulse oximetry, supplemental oxygen as necessary.  2. Sedation administered per anesthesia 3. Pacer pads placed anterior and posterior chest. 4. Cardioverted 1 time(s).  5. Cardioverted at 120J biphasic.  Complications:  Complications: None Patient did tolerate procedure well.  Impression:  1. No LAA thrombus. 2. 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  Mild MR,  trace to mild TR 4. Dilated LV with global hypokinesis, LVEF 35-40% 5. Successful DCCV with a single 120J biphasic shock to NSR.  Recommendations:   1. Continue anticoagulation with Eliquis and rate-control with cardizem for now. 2. 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 Most likely tachycardia-mediated. Will need A-fib clinic follow-up and consider starting additional CHF medication at that time.  Time Spent Directly with the Patient:  45 minutes   Chrystie NoseKenneth C. Maanav Kassabian, MD, [facility] Attending Cardiologist [facility]  01/18/2015, 2:59 PM

## 2015-01-19 ENCOUNTER — Encounter (HOSPITAL_COMMUNITY): Payer: Self-pay | Admitting: Internal Medicine

## 2015-01-24 ENCOUNTER — Encounter (HOSPITAL_COMMUNITY): Payer: Self-pay | Admitting: Nurse Practitioner

## 2015-01-24 ENCOUNTER — Ambulatory Visit (HOSPITAL_COMMUNITY)
Admission: RE | Admit: 2015-01-24 | Discharge: 2015-01-24 | Disposition: A | Discharge status: Home / Self Care | Payer: Medicare Other | Source: Ambulatory Visit | Attending: Nurse Practitioner | Admitting: Nurse Practitioner

## 2015-01-24 VITALS — BP 124/86 | HR 76 | Ht 61.0 in | Wt 233.6 lb

## 2015-01-24 DIAGNOSIS — I483 Typical atrial flutter: Secondary | ICD-10-CM | POA: Diagnosis present

## 2015-01-24 MED ORDER — METOPROLOL SUCCINATE ER 25 MG PO TB24: 25.0000 mg | ORAL_TABLET | Freq: Every day | ORAL | Status: DC

## 2015-01-24 MED ORDER — CARTIA XT 180 MG PO CP24: 180.0000 mg | ORAL_CAPSULE | Freq: Every day | ORAL | Status: DC

## 2015-01-24 NOTE — Patient Instructions (Signed)
Your physician has recommended you make the following change in your medication:  1)Decrease Cardizem to 180mg  once a day 2)Start metoprolol 25mg  once a day

## 2015-01-24 NOTE — Progress Notes (Signed)
Patient ID: Hannah [name], female   DOB: [birth year], 74 y.o.   MRN: [id number]     Primary Care Physician: Hollice EspyGATES,Iosefa Weintraub RUTH, MD Referring Physician:  Dr. Noemi ChapelAllred   Hannah [name] is a 74 y.o. female with a h/o aflutter that underwent successful cardioversion last week. She returns in SR today. She was mostly asymptomatic with the arrhythmias but does admit to feeling better in SR.he had to have a TEE with DCCV due to being off blood thinner the previous week x 2-3 days for an colonoscopy. 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   Today, she denies symptoms of palpitations, chest pain, shortness of breath, orthopnea, PND, lower extremity edema, dizziness, presyncope, syncope, or neurologic sequela. The patient is tolerating medications without difficulties and is otherwise without complaint today.   Past Medical History  Diagnosis Date  . Hypertension   . HLD (hyperlipidemia)     no medication  . Spinal stenosis     chronic pain  . DJD (degenerative joint disease)   . CKD (chronic kidney disease) stage 3, GFR 30-59 ml/min   . Colon polyps   . Diverticulosis   . Microscopic hematuria   . Subdural hematoma (HCC)     1987 - [facility] [place]  . Hx of cardiovascular stress test     Myoview 3/15: No ischemia EF 58%, low risk  . Hx of echocardiogram     Echo 4/16: EF 55-60%, no RWMA, mild BAE, atrial lipomatous hypertrophy, trivial MR, trivial TR  . A-fib (HCC)   . Intracranial hemorrhage (HCC)   . OSA (obstructive sleep apnea)     uses CPAP occasionally   Past Surgical History  Procedure Laterality Date  . Cholecystectomy    . Tee without cardioversion Hannah [name] 01/18/2015    Procedure: TRANSESOPHAGEAL ECHOCARDIOGRAM (TEE);  Surgeon: Chrystie NoseKenneth C Hilty, MD;  [facility];  Service: Cardiovascular;  Laterality: Hannah [name];  . Cardioversion Hannah [name] 01/18/2015    Procedure: CARDIOVERSION;  Surgeon: Chrystie NoseKenneth C Hilty, MD;  [facility];  Service: Cardiovascular;  Laterality: Hannah [name];    Current Outpatient Prescriptions  Medication Sig Dispense Refill  . ALPRAZolam (XANAX) 0.25 MG tablet Take 1 tablet by mouth At bedtime as needed for sleep.     Marland Kitchen. apixaban (ELIQUIS) 5 MG TABS tablet Take 1 tablet (5 mg total) by mouth 2 (two) times daily. 60 tablet 3  . CARTIA XT 180 MG 24 hr capsule Take 1 capsule (180 mg total) by mouth daily. 45 capsule 6  . diltiazem (CARDIZEM) 30 MG tablet 1 tablet every 4 hours AS NEEDED for afib HR>100 and BP>100 30 tablet 1  . hydrochlorothiazide (HYDRODIURIL) 25 MG tablet Take 12.5 mg by mouth Daily.     . metoprolol succinate (TOPROL XL) 25 MG 24 hr tablet Take 1 tablet (25 mg total) by mouth at bedtime. 30 tablet 3   No current facility-administered medications for this encounter.    No Known Allergies  Social History   Social History  . Marital Status: Divorced    Spouse Name: Hannah [name]  . Number of Children: Hannah [name]  . Years of Education: Hannah [name]   Occupational History  . Not on file.   Social History Main Topics  . Smoking status: Former Smoker    Types: Cigarettes  . Smokeless tobacco: Never Used  . Alcohol Use: 0.6 oz/week    1 Standard drinks or equivalent per week  Comment: twice a week  . Drug Use: No  . Sexual Activity: Not on file   Other Topics Concern  . Not on file   Social History Narrative    Family History  Problem Relation Age of Onset  . Cancer Mother   . Heart attack Mother   . Heart disease Mother   . Hypertension Sister   . Other Sister     subdural hematoma - brother and sister died  . Heart attack Father   . Suicidality Father   . Stroke Sister   . Diabetes Sister     ROS- All systems are reviewed and negative except as per the HPI above  Physical Exam: Filed Vitals:   01/24/15 1350  BP: 124/86  Pulse: 76  Height:  (1.549 m)  Weight: 233 lb 9.6 oz (105.96 kg)    GEN- The patient is well appearing, alert and oriented x 3 today.     Head- normocephalic, atraumatic Eyes-  Sclera clear, conjunctiva pink Ears- hearing intact Oropharynx- clear Neck- supple, no JVP Lymph- no cervical lymphadenopathy Lungs- Clear to ausculation bilaterally, normal work of breathing Heart- Regular rate and rhythm, no murmurs, rubs or gallops, PMI not laterally displaced GI- soft, NT, ND, + BS Extremities- no clubbing, cyanosis, or edema MS- no significant deformity or atrophy Skin- no rash or lesion Psych- euthymic mood, full affect Neuro- strength and sensation are intact  EKG- NSR at 76 bpm, pr int 168 ms, QRS int 78 ms, QTc 465 ms Epic records reviewed  Echo-Left ventricle: The cavity size was normal. There was mild concentric hypertrophy. Systolic function was moderately reduced. The estimated ejection fraction was in the range of 35% to 40%. Diffuse hypokinesis. - Aortic valve: No evidence of vegetation. - Mitral valve: No evidence of vegetation. - Left atrium: The atrium was dilated. No evidence of thrombus in the atrial cavity or appendage. - Right atrium: No evidence of thrombus in the atrial cavity or appendage. - Atrial septum: No defect or patent foramen ovale was identified.  Impressions:  - No LAA thrombus. Successful DCCV to NSR. There is moderate LV dysfunction, LVEF is 35-40% with global hypokinesis.  Assessment and Plan: 1. Aflutter Successful cardioversion Continue apixaban withouot missed doses.  2. 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 Important that SR be maintained Repeat echo at a later date  F/u in two weeks  Lupita Leash C. Matthew Folks Afib Clinic [facility] [address] [place], [place] [address] [phone]

## 2015-01-25 ENCOUNTER — Inpatient Hospital Stay (HOSPITAL_COMMUNITY): Admission: RE | Admit: 2015-01-25 | Payer: Medicare Other | Source: Ambulatory Visit | Admitting: Nurse Practitioner

## 2015-01-27 ENCOUNTER — Telehealth (HOSPITAL_COMMUNITY): Payer: Self-pay | Admitting: *Deleted

## 2015-01-27 NOTE — Telephone Encounter (Signed)
Pt called in stating she has flipped back [place] afib [place]. HR ranging 100-130s BP 135/88. Patient is still taking cardizem 180mg  twice daily and has not started metoprolol.  Instructed patient to go ahead and start metoprolol today and take cardizem tonight and call back tomorrow with HR/Symptoms.  Patient was instructed when she would need to go to ER and verbalized understanding. Patient will start metoprolol this afternoon.

## 2015-02-04 ENCOUNTER — Encounter (HOSPITAL_COMMUNITY): Payer: Self-pay | Admitting: Nurse Practitioner

## 2015-02-04 ENCOUNTER — Ambulatory Visit (HOSPITAL_COMMUNITY)
Admission: RE | Admit: 2015-02-04 | Discharge: 2015-02-04 | Disposition: A | Discharge status: Home / Self Care | Payer: Medicare Other | Source: Ambulatory Visit | Attending: Nurse Practitioner | Admitting: Nurse Practitioner

## 2015-02-04 VITALS — BP 120/68 | HR 65 | Ht 61.0 in | Wt 233.6 lb

## 2015-02-04 DIAGNOSIS — I483 Typical atrial flutter: Secondary | ICD-10-CM | POA: Diagnosis not present

## 2015-02-04 DIAGNOSIS — I4892 Unspecified atrial flutter: Secondary | ICD-10-CM | POA: Insufficient documentation

## 2015-02-04 NOTE — Patient Instructions (Signed)
Your physician has recommended you make the following change in your medication:  1)Cardizem 180mg  once a day 2)If heart rate is consistently staying in 40s -- may take only 1/2 tablet of metoprolol

## 2015-02-04 NOTE — Progress Notes (Signed)
Patient ID: Hannah [name], female   DOB: 07/01/1940, 74 y.o.   MRN: [id number]     Primary Care Physician: Hollice Espy, MD Referring Physician: Dr. Noemi Chapel Lozoya is a 74 y.o. female with a h/o aflutter that recently required cardioversion and is maintaining SR. TEE before the procedure showed TMC. She was started on BB. [place] with her [place] last week and called to the office one day and said that her heart rate was elevated. She had not added the metoprolol and was told to start this. She returns to the office today and is in SR. Occasionally, her heart rate is in the 40's although she is not symptomatic with this. We discussed she will just take cardizem daily instead of bid and continue metoprolol 25 mg daily.  Today, she denies symptoms of palpitations, chest pain, shortness of breath, orthopnea, PND, lower extremity edema, dizziness, presyncope, syncope, or neurologic sequela. The patient is tolerating medications without difficulties and is otherwise without complaint today.   Past Medical History  Diagnosis Date  . Hypertension   . HLD (hyperlipidemia)     no medication  . Spinal stenosis     chronic pain  . DJD (degenerative joint disease)   . CKD (chronic kidney disease) stage 3, GFR 30-59 ml/min   . Colon polyps   . Diverticulosis   . Microscopic hematuria   . Subdural hematoma (HCC)     1987 - treated at [facility] in [place]  . Hx of cardiovascular stress test     Myoview 3/15: No ischemia EF 58%, low risk  . Hx of echocardiogram     Echo 4/16: EF 55-60%, no RWMA, mild BAE, atrial lipomatous hypertrophy, trivial MR, trivial TR  . A-fib (HCC)   . Intracranial hemorrhage (HCC)   . OSA (obstructive sleep apnea)     uses CPAP occasionally   Past Surgical History  Procedure Laterality Date  . Cholecystectomy    . Tee without cardioversion N/A 01/18/2015    Procedure: TRANSESOPHAGEAL ECHOCARDIOGRAM (TEE);  Surgeon: Chrystie Nose,  MD;  Location[facility] ENDOSCOPY;  Service: Cardiovascular;  Laterality: N/A;  . Cardioversion N/A 01/18/2015    Procedure: CARDIOVERSION;  Surgeon: Chrystie Nose, MD;  [facility] ENDOSCOPY;  Service: Cardiovascular;  Laterality: N/A;    Current Outpatient Prescriptions  Medication Sig Dispense Refill  . ALPRAZolam (XANAX) 0.25 MG tablet Take 1 tablet by mouth At bedtime as needed for sleep.     Marland Kitchen apixaban (ELIQUIS) 5 MG TABS tablet Take 1 tablet (5 mg total) by mouth 2 (two) times daily. 60 tablet 3  . CARTIA XT 180 MG 24 hr capsule Take 1 capsule (180 mg total) by mouth daily. 45 capsule 6  . diltiazem (CARDIZEM) 30 MG tablet 1 tablet every 4 hours AS NEEDED for afib HR>100 and BP>100 30 tablet 1  . hydrochlorothiazide (HYDRODIURIL) 25 MG tablet Take 12.5 mg by mouth Daily.     . metoprolol succinate (TOPROL XL) 25 MG 24 hr tablet Take 1 tablet (25 mg total) by mouth at bedtime. 30 tablet 3   No current facility-administered medications for this encounter.    No Known Allergies  Social History   Social History  . Marital Status: Divorced    Spouse Name: N/A  . Number of Children: N/A  . Years of Education: N/A   Occupational History  . Not on file.   Social History Main Topics  . Smoking status: Former Smoker  Types: Cigarettes  . Smokeless tobacco: Never Used  . Alcohol Use: 0.6 oz/week    1 Standard drinks or equivalent per week     Comment: twice a week  . Drug Use: No  . Sexual Activity: Not on file   Other Topics Concern  . Not on file   Social History Narrative    Family History  Problem Relation Age of Onset  . Cancer Mother   . Heart attack Mother   . Heart disease Mother   . Hypertension Sister   . Other Sister     subdural hematoma - brother and sister died  . Heart attack Father   . Suicidality Father   . Stroke Sister   . Diabetes Sister     ROS- All systems are reviewed and negative except as per the HPI above  Physical Exam: Filed  Vitals:   02/04/15 0917  BP: 120/68  Pulse: 65  Height: 5\' 1"  (1.549 m)  Weight: 233 lb 9.6 oz (105.96 kg)    GEN- The patient is well appearing, alert and oriented x 3 today.   Head- normocephalic, atraumatic Eyes-  Sclera clear, conjunctiva pink Ears- hearing intact Oropharynx- clear Neck- supple, no JVP Lymph- no cervical lymphadenopathy Lungs- Clear to ausculation bilaterally, normal work of breathing Heart- Regular rate and rhythm, no murmurs, rubs or gallops, PMI not laterally displaced GI- soft, NT, ND, + BS Extremities- no clubbing, cyanosis, or edema MS- no significant deformity or atrophy Skin- no rash or lesion Psych- euthymic mood, full affect Neuro- strength and sensation are intact  EKG- NSR, v rate at 65 bpm, Pr int 162 bpm, QRS int 76 ms, Qtc 440 ms  Assessment and Plan: 1. A flutter Maintaining SR but with some periods of bradycardia Reduce diltiazem to 180 mg daily Continue metoprolol 25 mg dail If stays in SR, will need f/u echo to see if EF improves with SR. Continue apixaban  F/u afib clinic in one month  Lupita LeashDonna C. Matthew Folksarroll, ANP-C Afib Clinic [facility] [address] [place], [place] [address] [phone]

## 2015-02-10 ENCOUNTER — Ambulatory Visit (HOSPITAL_COMMUNITY): Payer: Medicare Other | Admitting: Nurse Practitioner

## 2015-03-08 ENCOUNTER — Inpatient Hospital Stay (HOSPITAL_COMMUNITY): Admission: RE | Admit: 2015-03-08 | Payer: Medicare Other | Source: Ambulatory Visit | Admitting: Nurse Practitioner

## 2015-03-11 ENCOUNTER — Inpatient Hospital Stay (HOSPITAL_COMMUNITY): Admission: RE | Admit: 2015-03-11 | Payer: Medicare Other | Source: Ambulatory Visit | Admitting: Nurse Practitioner

## 2015-03-15 ENCOUNTER — Encounter (HOSPITAL_COMMUNITY): Payer: Self-pay | Admitting: Nurse Practitioner

## 2015-03-15 ENCOUNTER — Ambulatory Visit (HOSPITAL_COMMUNITY)
Admission: RE | Admit: 2015-03-15 | Discharge: 2015-03-15 | Disposition: A | Discharge status: Home / Self Care | Payer: Medicare Other | Source: Ambulatory Visit | Attending: Nurse Practitioner | Admitting: Nurse Practitioner

## 2015-03-15 VITALS — BP 118/84 | HR 64 | Ht 61.0 in | Wt 234.0 lb

## 2015-03-15 DIAGNOSIS — I4892 Unspecified atrial flutter: Secondary | ICD-10-CM | POA: Insufficient documentation

## 2015-03-15 DIAGNOSIS — I480 Paroxysmal atrial fibrillation: Secondary | ICD-10-CM

## 2015-03-15 DIAGNOSIS — I10 Essential (primary) hypertension: Secondary | ICD-10-CM

## 2015-03-15 DIAGNOSIS — I483 Typical atrial flutter: Secondary | ICD-10-CM

## 2015-03-15 LAB — BASIC METABOLIC PANEL
Anion gap: 11 (ref 5–15)
BUN: 17 mg/dL (ref 6–20)
CALCIUM: 9.6 mg/dL (ref 8.9–10.3)
CHLORIDE: 102 mmol/L (ref 101–111)
CO2: 26 mmol/L (ref 22–32)
Creatinine, Ser: 0.97 mg/dL (ref 0.44–1.00)
GFR calc Af Amer: >60 mL/min (ref >60)
GFR, EST NON AFRICAN AMERICAN: 57 mL/min — AB (ref >60)
Glucose, Bld: 108 mg/dL — ABNORMAL HIGH (ref 65–99)
Potassium: 4.4 mmol/L (ref 3.5–5.1)
SODIUM: 139 mmol/L (ref 135–145)

## 2015-03-15 LAB — CBC
HCT: 42.6 % (ref 36.0–46.0)
HEMOGLOBIN: 14.5 g/dL (ref 12.0–15.0)
MCH: 30.5 pg (ref 26.0–34.0)
MCHC: 34 g/dL (ref 30.0–36.0)
MCV: 89.7 fL (ref 78.0–100.0)
Platelets: 239 10*3/uL (ref 150–400)
RBC: 4.75 MIL/uL (ref 3.87–5.11)
RDW: 13.7 % (ref 11.5–15.5)
WBC: 6 10*3/uL (ref 4.0–10.5)

## 2015-03-15 MED ORDER — APIXABAN 5 MG PO TABS: 5.0000 mg | ORAL_TABLET | Freq: Two times a day (BID) | ORAL | Status: DC

## 2015-03-15 NOTE — Patient Instructions (Signed)
Kennyth Arnold will call you once echocardiogram is scheduled.

## 2015-03-15 NOTE — Progress Notes (Signed)
Patient ID: Hannah [name], female   DOB: 27-Jan-1941, 75 y.o.   MRN: [id number]     Primary Care Physician: Hollice Espy, MD Referring Physician: Dr. Noemi Chapel Camille is a 75 y.o. female with a h/o aflutter that recently required cardioversion and is maintaining SR. TEE before the procedure showed TMC. She was started on BB. [place] week and called to the office one day and said that her heart rate was elevated. She had not added the metoprolol and was told to start this. She returned to  SR with metoprolol.   She returns to afib cliinc, states her heart rate has been normal. However, she got confused on her eliquis and has been taking two 5 mg tabs bid for at least two weeks. She has not noticed any bleeding or bruising. She called the drugstore this am for a refill and they would not refill for going through the drug too quick.   Today, she denies symptoms of palpitations, chest pain, shortness of breath, orthopnea, PND, lower extremity edema, dizziness, presyncope, syncope, or neurologic sequela. The patient is tolerating medications without difficulties and is otherwise without complaint today.   Past Medical History  Diagnosis Date  . Hypertension   . HLD (hyperlipidemia)     no medication  . Spinal stenosis     chronic pain  . DJD (degenerative joint disease)   . CKD (chronic kidney disease) stage 3, GFR 30-59 ml/min   . Colon polyps   . Diverticulosis   . Microscopic hematuria   . Subdural hematoma (HCC)     1987 - treated at [facility] in [place]  . Hx of cardiovascular stress test     Myoview 3/15: No ischemia EF 58%, low risk  . Hx of echocardiogram     Echo 4/16: EF 55-60%, no RWMA, mild BAE, atrial lipomatous hypertrophy, trivial MR, trivial TR  . A-fib (HCC)   . Intracranial hemorrhage (HCC)   . OSA (obstructive sleep apnea)     uses CPAP occasionally   Past Surgical History  Procedure Laterality Date  .  Cholecystectomy    . Tee without cardioversion N/A 01/18/2015    Procedure: TRANSESOPHAGEAL ECHOCARDIOGRAM (TEE);  Surgeon: Chrystie Nose, MD;  Location: [facility] ENDOSCOPY;  Service: Cardiovascular;  Laterality: N/A;  . Cardioversion N/A 01/18/2015    Procedure: CARDIOVERSION;  Surgeon: Chrystie Nose, MD;  Location: [facility] ENDOSCOPY;  Service: Cardiovascular;  Laterality: N/A;    Current Outpatient Prescriptions  Medication Sig Dispense Refill  . ALPRAZolam (XANAX) 0.25 MG tablet Take 1 tablet by mouth At bedtime as needed for sleep.     Marland Kitchen apixaban (ELIQUIS) 5 MG TABS tablet Take 1 tablet (5 mg total) by mouth 2 (two) times daily. 60 tablet 3  . CARTIA XT 180 MG 24 hr capsule Take 1 capsule (180 mg total) by mouth daily. 45 capsule 6  . diltiazem (CARDIZEM) 30 MG tablet 1 tablet every 4 hours AS NEEDED for afib HR>100 and BP>100 30 tablet 1  . hydrochlorothiazide (HYDRODIURIL) 25 MG tablet Take 12.5 mg by mouth Daily.     . metoprolol succinate (TOPROL XL) 25 MG 24 hr tablet Take 1 tablet (25 mg total) by mouth at bedtime. 30 tablet 3   No current facility-administered medications for this encounter.    No Known Allergies  Social History   Social History  . Marital Status: Divorced    Spouse Name: N/A  . Number  of Children: N/A  . Years of Education: N/A   Occupational History  . Not on file.   Social History Main Topics  . Smoking status: Former Smoker    Types: Cigarettes  . Smokeless tobacco: Never Used  . Alcohol Use: 0.6 oz/week    1 Standard drinks or equivalent per week     Comment: twice a week  . Drug Use: No  . Sexual Activity: Not on file   Other Topics Concern  . Not on file   Social History Narrative    Family History  Problem Relation Age of Onset  . Cancer Mother   . Heart attack Mother   . Heart disease Mother   . Hypertension Sister   . Other Sister     subdural hematoma - brother and sister died  . Heart attack Father   . Suicidality Father   .  Stroke Sister   . Diabetes Sister     ROS- All systems are reviewed and negative except as per the HPI above  Physical Exam: Filed Vitals:   03/15/15 1408  BP: 118/84  Pulse: 64  Height:  (1.549 m)  Weight: 234 lb (106.142 kg)    GEN- The patient is well appearing, alert and oriented x 3 today.   Head- normocephalic, atraumatic Eyes-  Sclera clear, conjunctiva pink Ears- hearing intact Oropharynx- clear Neck- supple, no JVP Lymph- no cervical lymphadenopathy Lungs- Clear to ausculation bilaterally, normal work of breathing Heart- Regular rate and rhythm, no murmurs, rubs or gallops, PMI not laterally displaced GI- soft, NT, ND, + BS Extremities- no clubbing, cyanosis, or edema MS- no significant deformity or atrophy Skin- no rash or lesion Psych- euthymic mood, full affect Neuro- strength and sensation are intact  EKG- NSR, v rate at 64 bpm, Pr int 164 bpm, QRS int 74 ms, Qtc 420 ms  Assessment and Plan: 1. A flutter Maintaining SR   Continue metoprolol 25 xl mg daily Cardizem 180 mg a day Continue eliquis but at correct dose of 5 mg bid, not 10 mg bid,as she has done for at least 2 weeks, fortunately does not appear to have suffered any ill effects of higher dose of anticoagulent. CBC/bmet today  Echo and f/u in early March to see if EF has improved with Normal rhythm    Hannah [name] Afib Clinic [facility] [address] [place], [place] [address] [phone]

## 2015-05-02 ENCOUNTER — Ambulatory Visit (HOSPITAL_COMMUNITY)
Admission: RE | Admit: 2015-05-02 | Discharge: 2015-05-02 | Disposition: A | Discharge status: Home / Self Care | Payer: Medicare Other | Source: Ambulatory Visit | Attending: Nurse Practitioner | Admitting: Nurse Practitioner

## 2015-05-02 DIAGNOSIS — I119 Hypertensive heart disease without heart failure: Secondary | ICD-10-CM | POA: Insufficient documentation

## 2015-05-02 DIAGNOSIS — I480 Paroxysmal atrial fibrillation: Secondary | ICD-10-CM | POA: Diagnosis not present

## 2015-05-02 DIAGNOSIS — Z87891 Personal history of nicotine dependence: Secondary | ICD-10-CM | POA: Insufficient documentation

## 2015-05-02 DIAGNOSIS — I371 Nonrheumatic pulmonary valve insufficiency: Secondary | ICD-10-CM | POA: Diagnosis not present

## 2015-05-02 DIAGNOSIS — I4891 Unspecified atrial fibrillation: Secondary | ICD-10-CM | POA: Diagnosis present

## 2015-05-02 NOTE — Progress Notes (Signed)
*  PRELIMINARY RESULTS* Echocardiogram 2D Echocardiogram has been performed.  Jeryl Columbialliott, Kizer Nobbe 05/02/2015, 1:57 PM

## 2015-05-04 ENCOUNTER — Inpatient Hospital Stay (HOSPITAL_COMMUNITY): Admission: RE | Admit: 2015-05-04 | Payer: Medicare Other | Source: Ambulatory Visit | Admitting: Nurse Practitioner

## 2015-05-06 ENCOUNTER — Inpatient Hospital Stay (HOSPITAL_COMMUNITY): Admission: RE | Admit: 2015-05-06 | Payer: Medicare Other | Source: Ambulatory Visit | Admitting: Nurse Practitioner

## 2015-05-09 ENCOUNTER — Encounter (HOSPITAL_COMMUNITY): Payer: Self-pay | Admitting: Nurse Practitioner

## 2015-05-09 ENCOUNTER — Ambulatory Visit (HOSPITAL_COMMUNITY)
Admission: RE | Admit: 2015-05-09 | Discharge: 2015-05-09 | Disposition: A | Discharge status: Home / Self Care | Payer: Medicare Other | Source: Ambulatory Visit | Attending: Nurse Practitioner | Admitting: Nurse Practitioner

## 2015-05-09 VITALS — BP 122/82 | HR 67 | Ht 61.0 in | Wt 242.2 lb

## 2015-05-09 DIAGNOSIS — I480 Paroxysmal atrial fibrillation: Secondary | ICD-10-CM

## 2015-05-09 DIAGNOSIS — Z7901 Long term (current) use of anticoagulants: Secondary | ICD-10-CM | POA: Diagnosis not present

## 2015-05-09 DIAGNOSIS — E785 Hyperlipidemia, unspecified: Secondary | ICD-10-CM | POA: Insufficient documentation

## 2015-05-09 DIAGNOSIS — I129 Hypertensive chronic kidney disease with stage 1 through stage 4 chronic kidney disease, or unspecified chronic kidney disease: Secondary | ICD-10-CM | POA: Insufficient documentation

## 2015-05-09 DIAGNOSIS — I4892 Unspecified atrial flutter: Secondary | ICD-10-CM | POA: Insufficient documentation

## 2015-05-09 DIAGNOSIS — Z8249 Family history of ischemic heart disease and other diseases of the circulatory system: Secondary | ICD-10-CM | POA: Insufficient documentation

## 2015-05-09 DIAGNOSIS — Z823 Family history of stroke: Secondary | ICD-10-CM | POA: Insufficient documentation

## 2015-05-09 DIAGNOSIS — Z79899 Other long term (current) drug therapy: Secondary | ICD-10-CM | POA: Diagnosis not present

## 2015-05-09 DIAGNOSIS — I4891 Unspecified atrial fibrillation: Secondary | ICD-10-CM | POA: Insufficient documentation

## 2015-05-09 DIAGNOSIS — Z833 Family history of diabetes mellitus: Secondary | ICD-10-CM | POA: Insufficient documentation

## 2015-05-09 DIAGNOSIS — M1990 Unspecified osteoarthritis, unspecified site: Secondary | ICD-10-CM | POA: Insufficient documentation

## 2015-05-09 DIAGNOSIS — Z87891 Personal history of nicotine dependence: Secondary | ICD-10-CM | POA: Diagnosis not present

## 2015-05-09 DIAGNOSIS — G4733 Obstructive sleep apnea (adult) (pediatric): Secondary | ICD-10-CM | POA: Diagnosis not present

## 2015-05-09 DIAGNOSIS — N183 Chronic kidney disease, stage 3 (moderate): Secondary | ICD-10-CM | POA: Insufficient documentation

## 2015-05-09 NOTE — Progress Notes (Signed)
Patient ID: Hannah [name], female   DOB: 1940/07/15, 75 y.o.   MRN: [id number]     Primary Care Physician: Hollice Espy, MD Referring Physician: Dr. Noemi Chapel Cauthon is a 75 y.o. female with a h/o afib/fluuter for f/u. She has not had any elevated heart rate.She does c/o of fatigue but  has untreated sleep apnea and a back condition that limits her ability to be active so is sedentary. She has greatly reduced caffeine and wine which I think has helped with breakthrough episodes. She continues on eliquis without bleeding issues. Echo recently done and reviewed with pt.  Today, she denies symptoms of palpitations, chest pain, shortness of breath, orthopnea, PND, lower extremity edema, dizziness, presyncope, syncope, or neurologic sequela. The patient is tolerating medications without difficulties and is otherwise without complaint today.   Past Medical History  Diagnosis Date  . Hypertension   . HLD (hyperlipidemia)     no medication  . Spinal stenosis     chronic pain  . DJD (degenerative joint disease)   . CKD (chronic kidney disease) stage 3, GFR 30-59 ml/min   . Colon polyps   . Diverticulosis   . Microscopic hematuria   . Subdural hematoma (HCC)     1987 - treated at [facility] in [place]  . Hx of cardiovascular stress test     Myoview 3/15: No ischemia EF 58%, low risk  . Hx of echocardiogram     Echo 4/16: EF 55-60%, no RWMA, mild BAE, atrial lipomatous hypertrophy, trivial MR, trivial TR  . A-fib (HCC)   . Intracranial hemorrhage (HCC)   . OSA (obstructive sleep apnea)     uses CPAP occasionally   Past Surgical History  Procedure Laterality Date  . Cholecystectomy    . Tee without cardioversion N/A 01/18/2015    Procedure: TRANSESOPHAGEAL ECHOCARDIOGRAM (TEE);  Surgeon: Chrystie Nose, MD;  Location: [facility] ENDOSCOPY;  Service: Cardiovascular;  Laterality: N/A;  . Cardioversion N/A 01/18/2015    Procedure: CARDIOVERSION;  Surgeon: Chrystie Nose, MD;  Location: [facility]  ENDOSCOPY;  Service: Cardiovascular;  Laterality: N/A;    Current Outpatient Prescriptions  Medication Sig Dispense Refill  . ALPRAZolam (XANAX) 0.25 MG tablet Take 1 tablet by mouth At bedtime as needed for sleep.     Marland Kitchen apixaban (ELIQUIS) 5 MG TABS tablet Take 1 tablet (5 mg total) by mouth 2 (two) times daily. 60 tablet 3  . CARTIA XT 180 MG 24 hr capsule Take 1 capsule (180 mg total) by mouth daily. 45 capsule 6  . diltiazem (CARDIZEM) 30 MG tablet 1 tablet every 4 hours AS NEEDED for afib HR>100 and BP>100 30 tablet 1  . hydrochlorothiazide (HYDRODIURIL) 25 MG tablet Take 12.5 mg by mouth Daily.     . metoprolol succinate (TOPROL XL) 25 MG 24 hr tablet Take 1 tablet (25 mg total) by mouth at bedtime. 30 tablet 3   No current facility-administered medications for this encounter.    No Known Allergies  Social History   Social History  . Marital Status: Divorced    Spouse Name: N/A  . Number of Children: N/A  . Years of Education: N/A   Occupational History  . Not on file.   Social History Main Topics  . Smoking status: Former Smoker    Types: Cigarettes  . Smokeless tobacco: Never Used  . Alcohol Use: 0.6 oz/week    1 Standard drinks or equivalent per week     Comment: twice a week  .  Drug Use: No  . Sexual Activity: Not on file   Other Topics Concern  . Not on file   Social History Narrative    Family History  Problem Relation Age of Onset  . Cancer Mother   . Heart attack Mother   . Heart disease Mother   . Hypertension Sister   . Other Sister     subdural hematoma - brother and sister died  . Heart attack Father   . Suicidality Father   . Stroke Sister   . Diabetes Sister     ROS- All systems are reviewed and negative except as per the HPI above  Physical Exam: Filed Vitals:   05/09/15 1007  BP: 122/82  Pulse: 67  Height: 5\' 1"  (1.549 m)  Weight: 242 lb 3.2 oz (109.861 kg)    GEN- The patient is well appearing, alert and oriented x 3 today.     Head- normocephalic, atraumatic Eyes-  Sclera clear, conjunctiva pink Ears- hearing intact Oropharynx- clear Neck- supple, no JVP Lymph- no cervical lymphadenopathy Lungs- Clear to ausculation bilaterally, normal work of breathing Heart- Regular rate and rhythm, no murmurs, rubs or gallops, PMI not laterally displaced GI- soft, NT, ND, + BS Extremities- no clubbing, cyanosis, or edema MS- no significant deformity or atrophy Skin- no rash or lesion Psych- euthymic mood, full affect Neuro- strength and sensation are intact  EKG- NSR 67 bpm, pr int 170 ms, qrs int 80 ms, qtc 431 ms Epic records reviewed Echo-- Left ventricle: The cavity size was normal. There was mild  concentric hypertrophy. Systolic function was normal. The  estimated ejection fraction was in the range of 50% to 55%. Wall  motion was normal; there were no regional wall motion  abnormalities. Features are consistent with a pseudonormal left  ventricular filling pattern, with concomitant abnormal relaxation  and increased filling pressure (grade 2 diastolic dysfunction).  Doppler parameters are consistent with elevated ventricular  end-diastolic filling pressure. - Aortic valve: Transvalvular velocity was within the normal range.  There was no stenosis. There was no regurgitation. - Mitral valve: Transvalvular velocity was within the normal range.  There was no evidence for stenosis. There was no regurgitation. - Left atrium: The atrium was severely dilated. - Right ventricle: The cavity size was normal. Wall thickness was  normal. Systolic function was normal. - Atrial septum: No defect or patent foramen ovale was identified  by color flow Doppler. - Tricuspid valve: There was no regurgitation. - Pulmonic valve: There was trivial regurgitation. - Inferior vena cava: The vessel was normal in size. The  respirophasic diameter changes were in the normal range (>= 50%),  consistent with normal central  venous pressure.  Assessment and Plan: 1. Afib/flutter Currently quite Continue metoprolol/diltiazem Continue apixaban 5 mg bid  2. HTN Stable   3. Sleep apnea Untreated States cannot tolerate cpap  Hannah [name], ANP-C [facility] [address] [place], [place] [address] [phone]

## 2015-06-11 ENCOUNTER — Other Ambulatory Visit (HOSPITAL_COMMUNITY): Payer: Self-pay | Admitting: Nurse Practitioner

## 2015-06-13 ENCOUNTER — Other Ambulatory Visit (HOSPITAL_COMMUNITY): Payer: Self-pay | Admitting: *Deleted

## 2015-06-13 MED ORDER — APIXABAN 5 MG PO TABS: 5.0000 mg | ORAL_TABLET | Freq: Two times a day (BID) | ORAL | Status: DC

## 2015-06-13 MED ORDER — CARTIA XT 180 MG PO CP24: 180.0000 mg | ORAL_CAPSULE | Freq: Every day | ORAL | Status: DC

## 2015-06-13 MED ORDER — DILTIAZEM HCL 30 MG PO TABS: ORAL_TABLET | ORAL | Status: DC

## 2015-06-27 ENCOUNTER — Encounter (HOSPITAL_COMMUNITY): Payer: Self-pay | Admitting: *Deleted

## 2015-07-07 ENCOUNTER — Other Ambulatory Visit: Payer: Self-pay | Admitting: Family Medicine

## 2015-07-07 DIAGNOSIS — M79605 Pain in left leg: Secondary | ICD-10-CM

## 2015-07-08 ENCOUNTER — Ambulatory Visit
Admission: RE | Admit: 2015-07-08 | Discharge: 2015-07-08 | Disposition: A | Discharge status: Home / Self Care | Payer: Medicare Other | Source: Ambulatory Visit | Attending: Family Medicine | Admitting: Family Medicine

## 2015-07-08 DIAGNOSIS — M79605 Pain in left leg: Secondary | ICD-10-CM

## 2015-07-11 ENCOUNTER — Ambulatory Visit (HOSPITAL_BASED_OUTPATIENT_CLINIC_OR_DEPARTMENT_OTHER): Payer: Medicare Other | Admitting: Physical Medicine & Rehabilitation

## 2015-07-11 ENCOUNTER — Encounter: Payer: Self-pay | Admitting: Physical Medicine & Rehabilitation

## 2015-07-11 ENCOUNTER — Encounter: Payer: Medicare Other | Attending: Physical Medicine & Rehabilitation

## 2015-07-11 VITALS — BP 127/56 | HR 55 | Resp 14

## 2015-07-11 DIAGNOSIS — N183 Chronic kidney disease, stage 3 (moderate): Secondary | ICD-10-CM | POA: Diagnosis not present

## 2015-07-11 DIAGNOSIS — G4733 Obstructive sleep apnea (adult) (pediatric): Secondary | ICD-10-CM | POA: Diagnosis not present

## 2015-07-11 DIAGNOSIS — M47816 Spondylosis without myelopathy or radiculopathy, lumbar region: Secondary | ICD-10-CM | POA: Insufficient documentation

## 2015-07-11 DIAGNOSIS — M545 Low back pain: Secondary | ICD-10-CM | POA: Insufficient documentation

## 2015-07-11 DIAGNOSIS — I4891 Unspecified atrial fibrillation: Secondary | ICD-10-CM | POA: Diagnosis not present

## 2015-07-11 DIAGNOSIS — M4800 Spinal stenosis, site unspecified: Secondary | ICD-10-CM | POA: Insufficient documentation

## 2015-07-11 DIAGNOSIS — I129 Hypertensive chronic kidney disease with stage 1 through stage 4 chronic kidney disease, or unspecified chronic kidney disease: Secondary | ICD-10-CM | POA: Diagnosis not present

## 2015-07-11 DIAGNOSIS — E785 Hyperlipidemia, unspecified: Secondary | ICD-10-CM | POA: Diagnosis not present

## 2015-07-11 DIAGNOSIS — F17210 Nicotine dependence, cigarettes, uncomplicated: Secondary | ICD-10-CM | POA: Diagnosis not present

## 2015-07-11 DIAGNOSIS — G8929 Other chronic pain: Secondary | ICD-10-CM | POA: Diagnosis present

## 2015-07-11 DIAGNOSIS — K635 Polyp of colon: Secondary | ICD-10-CM | POA: Insufficient documentation

## 2015-07-11 MED ORDER — ACETAMINOPHEN-CODEINE #3 300-30 MG PO TABS: 1.0000 | ORAL_TABLET | Freq: Three times a day (TID) | ORAL | Status: DC | PRN

## 2015-07-11 NOTE — Progress Notes (Signed)
Subjective:    Patient ID: Hannah FleetNancy Crotty, female    DOB: 1940/11/15, 75 y.o.   MRN: [id number] CC Low back pain HPI 75 year old female with chronic low back pain. She was last seen by me in 2013 following bilateral L3-L4 medial branch and L5 dorsal ramus injections performed 11/06/2011. Patient states that she lost weight and her back pain got better however after several deaths in the family and other stressors she gained her weight back. She is currently on a diet once again. Lives at [facility] , Lives at the end of the hallway. She is unable to ambulate as far as she used to. Very limited. She can stand for about 5-10 minutes at a time No pain that radiates down to the feet. She does have some radiation of the pain it's the right buttock. No numbness or tingling in the legs. No new bowel or bladder problems.  Falls or new injuries.  Patient has new diagnosis of atrial fibrillation about 6 months ago. She has given up wine and caffeine. She required cardioversion. Patient reports she is back in a normal rhythm now. She remains Eliquis  She is also scheduled for cataract surgery. She is not driving because of this.   Pain Inventory Average Pain 10 Pain Right Now 9 My pain is constant and aching  In the last 24 hours, has pain interfered with the following? General activity 10 Relation with others 10 Enjoyment of life 10 What TIME of day is your pain at its worst? morning, daytime, evening, night Sleep (in general) Fair  Pain is worse with: walking and standing Pain improves with: sitting  Relief from Meds: 0  Mobility how many minutes can you walk? <1 ability to climb steps?  no do you drive?  yes Do you have any goals in this area?  yes  Function retired  Neuro/Psych trouble walking depression  Prior Studies Any changes since last visit?  no  Physicians involved in your care Primary care .   Family History  Problem Relation Age of Onset    . Cancer Mother   . Heart attack Mother   . Heart disease Mother   . Hypertension Sister   . Other Sister     subdural hematoma - brother and sister died  . Heart attack Father   . Suicidality Father   . Stroke Sister   . Diabetes Sister    Social History   Social History  . Marital Status: Divorced    Spouse Name: N/A  . Number of Children: N/A  . Years of Education: N/A   Social History Main Topics  . Smoking status: Current Every Day Smoker    Types: Cigarettes  . Smokeless tobacco: Never Used     Comment: 4 cigarettes per day  . Alcohol Use: 0.6 oz/week    1 Standard drinks or equivalent per week     Comment: twice a week  . Drug Use: No  . Sexual Activity: Not Asked   Other Topics Concern  . None   Social History Narrative   Past Surgical History  Procedure Laterality Date  . Cholecystectomy    . Tee without cardioversion N/A 01/18/2015    Procedure: TRANSESOPHAGEAL ECHOCARDIOGRAM (TEE);  Surgeon: Chrystie NoseKenneth C Hilty, MD;  [facility] END[facility]COPY;  Service: Cardiovascular;  Laterality: N/A;  . Cardioversion N/A 01/18/2015    Procedure: CARDIOVERSION;  Surgeon: Chrystie NoseKenneth C Hilty, MD;  [facility];  Service: Cardiovascular;  Laterality: N/A;   Past  Medical History  Diagnosis Date  . Hypertension   . HLD (hyperlipidemia)     no medication  . Spinal stenosis     chronic pain  . DJD (degenerative joint disease)   . CKD (chronic kidney disease) stage 3, GFR 30-59 ml/min   . Colon polyps   . Diverticulosis   . Microscopic hematuria   . Subdural hematoma (HCC)     1987 - treated at [facility] in [place]  . Hx of cardiovascular stress test     Myoview 3/15: No ischemia EF 58%, low risk  . Hx of echocardiogram     Echo 4/16: EF 55-60%, no RWMA, mild BAE, atrial lipomatous hypertrophy, trivial MR, trivial TR  . A-fib (HCC)   . Intracranial hemorrhage (HCC)   . OSA (obstructive sleep apnea)     uses CPAP occasionally   BP 127/56 mmHg  Pulse 55  Resp 14   SpO2 97%  Opioid Risk Score:   Fall Risk Score:  `1  Depression screen PHQ 2/9  Depression screen PHQ 2/9 07/11/2015  Decreased Interest 3  Down, Depressed, Hopeless 3  PHQ - 2 Score 6  Altered sleeping 3  Tired, decreased energy 3  Change in appetite 3  Feeling bad or failure about yourself  3  Trouble concentrating 3  Moving slowly or fidgety/restless 0  Suicidal thoughts 0  PHQ-9 Score 21  Difficult doing work/chores Not difficult at all     Review of Systems  Respiratory: Positive for apnea.   Musculoskeletal: Positive for gait problem.  Psychiatric/Behavioral: Positive for dysphoric mood.  All other systems reviewed and are negative.      Objective:   Physical Exam  Constitutional: She is oriented to person, place, and time. She appears well-developed and well-nourished.  HENT:  Head: Normocephalic and atraumatic.  Eyes: Conjunctivae and EOM are normal. Pupils are equal, round, and reactive to light.  Musculoskeletal:       Right hip: She exhibits normal range of motion and no bony tenderness.       Left hip: She exhibits normal range of motion and no bony tenderness.       Lumbar back: She exhibits decreased range of motion. She exhibits no tenderness, no edema and no deformity.  Patient with 75% flexion 25% extension 25% lateral bending and 50% rotation. Extension is the most painful position.  Neurological: She is alert and oriented to person, place, and time. She has normal reflexes. She exhibits normal muscle tone. Coordination normal.  Skin: Skin is warm and dry.  Psychiatric: She has a normal mood and affect.  Nursing note and vitals reviewed.  Motor strength is 5/5 bilateral deltoids, biceps, triceps, grip, hip flexor, knee extensor, ankle dorsiflexor and plantar flexor Sensation intact to light touch and pinprick in bilateral lower limbs.       Assessment & Plan:   1. Chronic axial low back pain no evidence of sciatica. She has had good results  with lumbar medial branch blocks targeting facet mediated pain. Will repeat L3-L4 medial branch blocks and L5 dorsal ramus injections bilaterally. We discussed the fact that she is now on Eliquis and will now need to get permission to stop this for 2 days before and resume it the day after the injection. Have sent a message to her cardiologist office

## 2015-08-01 ENCOUNTER — Ambulatory Visit: Payer: Medicare Other | Admitting: Physical Medicine & Rehabilitation

## 2015-08-22 ENCOUNTER — Ambulatory Visit: Payer: Medicare Other | Admitting: Physical Medicine & Rehabilitation

## 2015-08-22 ENCOUNTER — Other Ambulatory Visit: Payer: Self-pay | Admitting: Physical Medicine & Rehabilitation

## 2015-08-23 NOTE — Telephone Encounter (Signed)
Will need UDS at next visit

## 2015-08-23 NOTE — Telephone Encounter (Signed)
rec'd electronic request to refill tylenol #3.......please advise 

## 2015-08-23 NOTE — Telephone Encounter (Signed)
rec'd electronic request to refill tylenol #3......Marland Kitchen.please advise

## 2015-09-19 ENCOUNTER — Encounter: Payer: Medicare Other | Attending: Physical Medicine & Rehabilitation

## 2015-09-19 ENCOUNTER — Ambulatory Visit: Payer: Medicare Other | Admitting: Physical Medicine & Rehabilitation

## 2015-09-19 DIAGNOSIS — I129 Hypertensive chronic kidney disease with stage 1 through stage 4 chronic kidney disease, or unspecified chronic kidney disease: Secondary | ICD-10-CM | POA: Insufficient documentation

## 2015-09-19 DIAGNOSIS — M47816 Spondylosis without myelopathy or radiculopathy, lumbar region: Secondary | ICD-10-CM | POA: Insufficient documentation

## 2015-09-19 DIAGNOSIS — M4800 Spinal stenosis, site unspecified: Secondary | ICD-10-CM | POA: Insufficient documentation

## 2015-09-19 DIAGNOSIS — E785 Hyperlipidemia, unspecified: Secondary | ICD-10-CM | POA: Insufficient documentation

## 2015-09-19 DIAGNOSIS — G4733 Obstructive sleep apnea (adult) (pediatric): Secondary | ICD-10-CM | POA: Insufficient documentation

## 2015-09-19 DIAGNOSIS — G8929 Other chronic pain: Secondary | ICD-10-CM | POA: Insufficient documentation

## 2015-09-19 DIAGNOSIS — I4891 Unspecified atrial fibrillation: Secondary | ICD-10-CM | POA: Insufficient documentation

## 2015-09-19 DIAGNOSIS — M545 Low back pain: Secondary | ICD-10-CM | POA: Insufficient documentation

## 2015-09-19 DIAGNOSIS — K635 Polyp of colon: Secondary | ICD-10-CM | POA: Insufficient documentation

## 2015-09-19 DIAGNOSIS — N183 Chronic kidney disease, stage 3 (moderate): Secondary | ICD-10-CM | POA: Insufficient documentation

## 2015-09-19 DIAGNOSIS — F17210 Nicotine dependence, cigarettes, uncomplicated: Secondary | ICD-10-CM | POA: Insufficient documentation

## 2015-10-04 ENCOUNTER — Other Ambulatory Visit: Payer: Self-pay | Admitting: Family Medicine

## 2015-10-04 DIAGNOSIS — Z1231 Encounter for screening mammogram for malignant neoplasm of breast: Secondary | ICD-10-CM

## 2015-10-10 ENCOUNTER — Other Ambulatory Visit (HOSPITAL_COMMUNITY): Payer: Self-pay | Admitting: *Deleted

## 2015-10-10 MED ORDER — APIXABAN 5 MG PO TABS: 5.0000 mg | ORAL_TABLET | Freq: Two times a day (BID) | ORAL | 2 refills | Status: DC

## 2015-11-04 ENCOUNTER — Ambulatory Visit: Payer: Medicare Other

## 2015-11-08 ENCOUNTER — Ambulatory Visit (HOSPITAL_COMMUNITY)
Admission: RE | Admit: 2015-11-08 | Discharge: 2015-11-08 | Disposition: A | Discharge status: Home / Self Care | Payer: Medicare Other | Source: Ambulatory Visit | Attending: Nurse Practitioner | Admitting: Nurse Practitioner

## 2015-11-08 ENCOUNTER — Other Ambulatory Visit (HOSPITAL_COMMUNITY): Payer: Self-pay | Admitting: Nurse Practitioner

## 2015-11-08 VITALS — BP 110/60 | HR 69 | Ht 61.0 in | Wt 235.6 lb

## 2015-11-08 DIAGNOSIS — F17210 Nicotine dependence, cigarettes, uncomplicated: Secondary | ICD-10-CM | POA: Diagnosis not present

## 2015-11-08 DIAGNOSIS — I129 Hypertensive chronic kidney disease with stage 1 through stage 4 chronic kidney disease, or unspecified chronic kidney disease: Secondary | ICD-10-CM | POA: Insufficient documentation

## 2015-11-08 DIAGNOSIS — N183 Chronic kidney disease, stage 3 (moderate): Secondary | ICD-10-CM | POA: Insufficient documentation

## 2015-11-08 DIAGNOSIS — Z7901 Long term (current) use of anticoagulants: Secondary | ICD-10-CM | POA: Insufficient documentation

## 2015-11-08 DIAGNOSIS — G4733 Obstructive sleep apnea (adult) (pediatric): Secondary | ICD-10-CM | POA: Diagnosis not present

## 2015-11-08 DIAGNOSIS — I4892 Unspecified atrial flutter: Secondary | ICD-10-CM | POA: Insufficient documentation

## 2015-11-08 DIAGNOSIS — I480 Paroxysmal atrial fibrillation: Secondary | ICD-10-CM | POA: Diagnosis present

## 2015-11-08 DIAGNOSIS — E785 Hyperlipidemia, unspecified: Secondary | ICD-10-CM | POA: Insufficient documentation

## 2015-11-08 MED ORDER — DILTIAZEM HCL 30 MG PO TABS: ORAL_TABLET | ORAL | 3 refills | Status: DC

## 2015-11-08 NOTE — Progress Notes (Signed)
Patient ID: Hannah [name], female   DOB: 1940/12/12, 75 y.o.   MRN: [id number]     Primary Care Physician: Hollice Espy, MD Referring Physician: Dr. Noemi Chapel [place] is a 75 y.o. female with a h/o afib/flutter for f/u. She has had only one episode of afib that lasted a few hours because she forgot to take her meds that day. Otherwise, she is doing well. She had cataract surgery recently without issues.She does c/o of fatigue but  has untreated sleep apnea and a back condition that limits her ability to be active. She will be seeing a sleep doctor at the [place] center who is going to try a new mask on her, to see if she can tolerate.She has greatly reduced caffeine and wine which I think has helped with breakthrough episodes.  Continues to smoke. She continues on eliquis without bleeding issues.  She states a battery of labs were done at the [place] within a few months ago.  Today, she denies symptoms of palpitations, chest pain, shortness of breath, orthopnea, PND, lower extremity edema, dizziness, presyncope, syncope, or neurologic sequela. The patient is tolerating medications without difficulties and is otherwise without complaint today.   Past Medical History:  Diagnosis Date  . A-fib (HCC)   . CKD (chronic kidney disease) stage 3, GFR 30-59 ml/min   . Colon polyps   . Diverticulosis   . DJD (degenerative joint disease)   . HLD (hyperlipidemia)    no medication  . Hx of cardiovascular stress test    Myoview 3/15: No ischemia EF 58%, low risk  . Hx of echocardiogram    Echo 4/16: EF 55-60%, no RWMA, mild BAE, atrial lipomatous hypertrophy, trivial MR, trivial TR  . Hypertension   . Intracranial hemorrhage (HCC)   . Microscopic hematuria   . OSA (obstructive sleep apnea)    uses CPAP occasionally  . Spinal stenosis    chronic pain  . Subdural hematoma (HCC)    1987 - treated at [facility] in [place]   Past Surgical History:  Procedure Laterality Date  . CARDIOVERSION N/A  01/18/2015   Procedure: CARDIOVERSION;  Surgeon: Chrystie Nose, MD;  Location: [facility] ENDOSCOPY;  Service: Cardiovascular;  Laterality: N/A;  . CHOLECYSTECTOMY    . TEE WITHOUT CARDIOVERSION N/A 01/18/2015   Procedure: TRANSESOPHAGEAL ECHOCARDIOGRAM (TEE);  Surgeon: Chrystie Nose, MD;  Location: [facility] ENDOSCOPY;  Service: Cardiovascular;  Laterality: N/A;    Current Outpatient Prescriptions  Medication Sig Dispense Refill  . acetaminophen-codeine (TYLENOL #3) 300-30 MG tablet TAKE 1 TABLET BY MOUTH EVERY 8 HOURS AS NEEDED FOR MODERATE PAIN 30 tablet 0  . ALPRAZolam (XANAX) 0.25 MG tablet Take 1 tablet by mouth At bedtime as needed for sleep.     Marland Kitchen apixaban (ELIQUIS) 5 MG TABS tablet Take 1 tablet (5 mg total) by mouth 2 (two) times daily. 180 tablet 2  . CARTIA XT 180 MG 24 hr capsule Take 1 capsule (180 mg total) by mouth daily. 30 capsule 6  . hydrochlorothiazide (HYDRODIURIL) 25 MG tablet Take 12.5 mg by mouth Daily.     . metoprolol succinate (TOPROL-XL) 25 MG 24 hr tablet TAKE 1 TABLET BY MOUTH EVERY NIGHT AT BEDTIME 30 tablet 6  . diltiazem (CARDIZEM) 30 MG tablet TAKE 1 TABLET BY MOUTH EVERY 4 HOURS AS NEEDED FOR FOR AFIB.(HEART RATE GREATER THEN 100 AND BLOOD PRESSURE GREATER THEN 100) 45 tablet 3   No current facility-administered medications for this encounter.     No  Known Allergies  Social History   Social History  . Marital status: Divorced    Spouse name: N/A  . Number of children: N/A  . Years of education: N/A   Occupational History  . Not on file.   Social History Main Topics  . Smoking status: Current Every Day Smoker    Types: Cigarettes  . Smokeless tobacco: Never Used     Comment: 4 cigarettes per day  . Alcohol use 0.6 oz/week    1 Standard drinks or equivalent per week     Comment: twice a week  . Drug use: No  . Sexual activity: Not on file   Other Topics Concern  . Not on file   Social History Narrative  . No narrative on file    Family History   Problem Relation Age of Onset  . Cancer Mother   . Heart attack Mother   . Heart disease Mother   . Hypertension Sister   . Other Sister     subdural hematoma - brother and sister died  . Heart attack Father   . Suicidality Father   . Stroke Sister   . Diabetes Sister     ROS- All systems are reviewed and negative except as per the HPI above  Physical Exam: Vitals:   11/08/15 1144  BP: 110/60  Pulse: 69  Weight: 235 lb 9.6 oz (106.9 kg)  Height: 5\' 1"  (1.549 m)    GEN- The patient is well appearing, alert and oriented x 3 today.   Head- normocephalic, atraumatic Eyes-  Sclera clear, conjunctiva pink Ears- hearing intact Oropharynx- clear Neck- supple, no JVP Lymph- no cervical lymphadenopathy Lungs- Clear to ausculation bilaterally, normal work of breathing Heart- Regular rate and rhythm, no murmurs, rubs or gallops, PMI not laterally displaced GI- soft, NT, ND, + BS Extremities- no clubbing, cyanosis, or edema MS- no significant deformity or atrophy Skin- no rash or lesion Psych- euthymic mood, full affect Neuro- strength and sensation are intact  EKG- NSR 69 bpm, pr int 152 ms, qrs int 74 ms, qtc 435 ms Epic records reviewed Echo-- Left ventricle: The cavity size was normal. There was mild  concentric hypertrophy. Systolic function was normal. The  estimated ejection fraction was in the range of 50% to 55%. Wall  motion was normal; there were no regional wall motion  abnormalities. Features are consistent with a pseudonormal left  ventricular filling pattern, with concomitant abnormal relaxation  and increased filling pressure (grade 2 diastolic dysfunction).  Doppler parameters are consistent with elevated ventricular  end-diastolic filling pressure. - Aortic valve: Transvalvular velocity was within the normal range.  There was no stenosis. There was no regurgitation. - Mitral valve: Transvalvular velocity was within the normal range.  There was no  evidence for stenosis. There was no regurgitation. - Left atrium: The atrium was severely dilated. - Right ventricle: The cavity size was normal. Wall thickness was  normal. Systolic function was normal. - Atrial septum: No defect or patent foramen ovale was identified  by color flow Doppler. - Tricuspid valve: There was no regurgitation. - Pulmonic valve: There was trivial regurgitation. - Inferior vena cava: The vessel was normal in size. The  respirophasic diameter changes were in the normal range (>= 50%),  consistent with normal central venous pressure.  Assessment and Plan: 1. Afib/flutter Currently quite Continue metoprolol/diltiazem Continue apixaban 5 mg bid  2. HTN Stable   3. Sleep apnea Untreated States cannot tolerate cpap Is seeing a sleep MD  to try a new mask, explained again incidence of afib and untreated sleep apnea  Lupita Leash C. Matthew Folks [facility] [address] [place], [place] [address] [phone]

## 2015-11-21 ENCOUNTER — Ambulatory Visit: Payer: Medicare Other

## 2015-12-01 ENCOUNTER — Ambulatory Visit: Payer: Medicare Other

## 2016-02-03 ENCOUNTER — Other Ambulatory Visit (HOSPITAL_COMMUNITY): Payer: Self-pay | Admitting: Nurse Practitioner

## 2016-03-06 ENCOUNTER — Inpatient Hospital Stay (HOSPITAL_COMMUNITY): Admission: RE | Admit: 2016-03-06 | Payer: Medicare Other | Source: Ambulatory Visit | Admitting: Nurse Practitioner

## 2016-03-08 ENCOUNTER — Encounter (HOSPITAL_COMMUNITY): Payer: Self-pay | Admitting: Nurse Practitioner

## 2016-03-08 ENCOUNTER — Ambulatory Visit (HOSPITAL_COMMUNITY)
Admission: RE | Admit: 2016-03-08 | Discharge: 2016-03-08 | Disposition: A | Discharge status: Home / Self Care | Payer: Medicare Other | Source: Ambulatory Visit | Attending: Nurse Practitioner | Admitting: Nurse Practitioner

## 2016-03-08 VITALS — BP 122/64 | HR 60 | Ht 61.0 in | Wt 249.8 lb

## 2016-03-08 DIAGNOSIS — M4800 Spinal stenosis, site unspecified: Secondary | ICD-10-CM | POA: Insufficient documentation

## 2016-03-08 DIAGNOSIS — Z79899 Other long term (current) drug therapy: Secondary | ICD-10-CM | POA: Diagnosis not present

## 2016-03-08 DIAGNOSIS — I129 Hypertensive chronic kidney disease with stage 1 through stage 4 chronic kidney disease, or unspecified chronic kidney disease: Secondary | ICD-10-CM | POA: Diagnosis not present

## 2016-03-08 DIAGNOSIS — I480 Paroxysmal atrial fibrillation: Secondary | ICD-10-CM

## 2016-03-08 DIAGNOSIS — R200 Anesthesia of skin: Secondary | ICD-10-CM | POA: Insufficient documentation

## 2016-03-08 DIAGNOSIS — G4733 Obstructive sleep apnea (adult) (pediatric): Secondary | ICD-10-CM | POA: Insufficient documentation

## 2016-03-08 DIAGNOSIS — N183 Chronic kidney disease, stage 3 (moderate): Secondary | ICD-10-CM | POA: Insufficient documentation

## 2016-03-08 DIAGNOSIS — M1990 Unspecified osteoarthritis, unspecified site: Secondary | ICD-10-CM | POA: Diagnosis not present

## 2016-03-08 DIAGNOSIS — Z7901 Long term (current) use of anticoagulants: Secondary | ICD-10-CM | POA: Insufficient documentation

## 2016-03-08 DIAGNOSIS — F17210 Nicotine dependence, cigarettes, uncomplicated: Secondary | ICD-10-CM | POA: Diagnosis not present

## 2016-03-08 DIAGNOSIS — E785 Hyperlipidemia, unspecified: Secondary | ICD-10-CM | POA: Insufficient documentation

## 2016-03-08 DIAGNOSIS — I4891 Unspecified atrial fibrillation: Secondary | ICD-10-CM | POA: Diagnosis not present

## 2016-03-08 NOTE — Progress Notes (Signed)
Patient ID: Hannah [name], female   DOB: 1940/09/14, 76 y.o.   MRN: [id number]     Primary Care Physician: Hollice Espy, MD Referring Physician: Dr. Noemi Chapel Gonser is a 76 y.o. female with a h/o afib/flutter for f/u. She has had only one episode of afib that lasted a few hours because she forgot to take her meds that day. Otherwise, she is doing well. She had cataract surgery recently without issues.She does c/o of fatigue but  has untreated sleep apnea and a back condition that limits her ability to be active. She will be seeing a sleep doctor at the [place] [facility] who is going to try a new mask on her, to see if she can tolerate.She has greatly reduced caffeine and wine which I think has helped with breakthrough episodes.  Continues to smoke. She continues on eliquis without bleeding issues.  She states a battery of labs were done at the [place] within a few months ago.  She asked to be seen in the afib clinic today for rt sided arm tingling at times. This is not associated with afib and her afib has been quiet. She is going out of town to stay with her sister and wanted to make sure sure it was not her heart. She is usually at rest and movement of the arm will usually relieve the symptoms. She does not have any associated chest pain  and is not exertional at the time. She does not notice any further rt sided weakness at the time or vision/ speech issues. She have been told she has spinal stenosis and back injections have been recommended but she has deferred due to being on a blood thinner. She has not been back to her orthopedist in some time.  Today, she denies symptoms of palpitations, chest pain, shortness of breath, orthopnea, PND, lower extremity edema, dizziness, presyncope, syncope, or neurologic sequela. The patient is tolerating medications without difficulties and is otherwise without complaint today.   Past Medical History:  Diagnosis Date  . A-fib (HCC)   . CKD (chronic kidney  disease) stage 3, GFR 30-59 ml/min   . Colon polyps   . Diverticulosis   . DJD (degenerative joint disease)   . HLD (hyperlipidemia)    no medication  . Hx of cardiovascular stress test    Myoview 3/15: No ischemia EF 58%, low risk  . Hx of echocardiogram    Echo 4/16: EF 55-60%, no RWMA, mild BAE, atrial lipomatous hypertrophy, trivial MR, trivial TR  . Hypertension   . Intracranial hemorrhage (HCC)   . Microscopic hematuria   . OSA (obstructive sleep apnea)    uses CPAP occasionally  . Spinal stenosis    chronic pain  . Subdural hematoma (HCC)    1987 - treated at [facility] in [place]   Past Surgical History:  Procedure Laterality Date  . CARDIOVERSION N/A 01/18/2015   Procedure: CARDIOVERSION;  Surgeon: Chrystie Nose, MD;  Location: [facility] ENDOSCOPY;  Service: Cardiovascular;  Laterality: N/A;  . CHOLECYSTECTOMY    . TEE WITHOUT CARDIOVERSION N/A 01/18/2015   Procedure: TRANSESOPHAGEAL ECHOCARDIOGRAM (TEE);  Surgeon: Chrystie Nose, MD;  Location: [facility] ENDOSCOPY;  Service: Cardiovascular;  Laterality: N/A;    Current Outpatient Prescriptions  Medication Sig Dispense Refill  . acetaminophen-codeine (TYLENOL #3) 300-30 MG tablet TAKE 1 TABLET BY MOUTH EVERY 8 HOURS AS NEEDED FOR MODERATE PAIN 30 tablet 0  . ALPRAZolam (XANAX) 0.25 MG tablet Take 1 tablet by mouth At bedtime as  needed for sleep.     Marland Kitchen. apixaban (ELIQUIS) 5 MG TABS tablet Take 1 tablet (5 mg total) by mouth 2 (two) times daily. 180 tablet 2  . CARTIA XT 180 MG 24 hr capsule TAKE ONE CAPSULE BY MOUTH ONCE DAILY 30 capsule 6  . diltiazem (CARDIZEM) 30 MG tablet TAKE 1 TABLET BY MOUTH EVERY 4 HOURS AS NEEDED FOR FOR AFIB.(HEART RATE GREATER THEN 100 AND BLOOD PRESSURE GREATER THEN 100) 45 tablet 3  . hydrochlorothiazide (HYDRODIURIL) 25 MG tablet Take 12.5 mg by mouth Daily.     . metoprolol succinate (TOPROL-XL) 25 MG 24 hr tablet TAKE 1 TABLET BY MOUTH EVERY NIGHT AT BEDTIME 30 tablet 6   No current facility-administered  medications for this encounter.     No Known Allergies  Social History   Social History  . Marital status: Divorced    Spouse name: N/A  . Number of children: N/A  . Years of education: N/A   Occupational History  . Not on file.   Social History Main Topics  . Smoking status: Current Every Day Smoker    Types: Cigarettes  . Smokeless tobacco: Never Used     Comment: 4 cigarettes per day  . Alcohol use 0.6 oz/week    1 Standard drinks or equivalent per week     Comment: twice a week  . Drug use: No  . Sexual activity: Not on file   Other Topics Concern  . Not on file   Social History Narrative  . No narrative on file    Family History  Problem Relation Age of Onset  . Cancer Mother   . Heart attack Mother   . Heart disease Mother   . Hypertension Sister   . Other Sister     subdural hematoma - brother and sister died  . Heart attack Father   . Suicidality Father   . Stroke Sister   . Diabetes Sister     ROS- All systems are reviewed and negative except as per the HPI above  Physical Exam: Vitals:   03/08/16 1030  BP: 122/64  Pulse: 60  Weight: 249 lb 12.8 oz (113.3 kg)  Height: 5\' 1"  (1.549 m)    GEN- The patient is well appearing, alert and oriented x 3 today.   Head- normocephalic, atraumatic Eyes-  Sclera clear, conjunctiva pink Ears- hearing intact Oropharynx- clear Neck- supple, no JVP Lymph- no cervical lymphadenopathy Lungs- Clear to ausculation bilaterally, normal work of breathing Heart- Regular rate and rhythm, no murmurs, rubs or gallops, PMI not laterally displaced GI- soft, NT, ND, + BS Extremities- no clubbing, cyanosis, or edema MS- no significant deformity or atrophy Skin- no rash or lesion Psych- euthymic mood, full affect Neuro- strength and sensation are intact  EKG- NSR 69 bpm, pr int 152 ms, qrs int 74 ms, qtc 435 ms Epic records reviewed Echo-- Left ventricle: The cavity size was normal. There was mild  concentric  hypertrophy. Systolic function was normal. The  estimated ejection fraction was in the range of 50% to 55%. Wall  motion was normal; there were no regional wall motion  abnormalities. Features are consistent with a pseudonormal left  ventricular filling pattern, with concomitant abnormal relaxation  and increased filling pressure (grade 2 diastolic dysfunction).  Doppler parameters are consistent with elevated ventricular  end-diastolic filling pressure. - Aortic valve: Transvalvular velocity was within the normal range.  There was no stenosis. There was no regurgitation. - Mitral valve: Transvalvular velocity was  within the normal range.  There was no evidence for stenosis. There was no regurgitation. - Left atrium: The atrium was severely dilated. - Right ventricle: The cavity size was normal. Wall thickness was  normal. Systolic function was normal. - Atrial septum: No defect or patent foramen ovale was identified  by color flow Doppler. - Tricuspid valve: There was no regurgitation. - Pulmonic valve: There was trivial regurgitation. - Inferior vena cava: The vessel was normal in size. The  respirophasic diameter changes were in the normal range (>= 50%),  consistent with normal central venous pressure.  Assessment and Plan: 1. Afib/flutter Currently quite Continue metoprolol/diltiazem Continue apixaban 5 mg bid  2. HTN Stable   3. Sleep apnea Untreated States cannot tolerate cpap Is seeing a sleep MD to try a new mask, explained again incidence of afib and untreated sleep apnea  4. Intermittent rt arm numbness Will resolve with moving rt arm No other associated neuro symptoms  Not exertional in nature Does have known spinal stenosis but has not seen orthopaedist in some time Advised if symptoms worsen to see the MD that has evaluated her back issues in the past  F/u here in 6 months  Lupita Leash C. [facility] [address] [place], [place] [address] [phone]

## 2016-03-09 NOTE — Addendum Note (Signed)
Encounter addended by: Newman Niponna C Lamees Gable, NP on: 03/09/2016 11:24 AM<BR>    Actions taken: LOS modified

## 2016-03-23 IMAGING — CR DG CHEST 2V
2 series · 2 of 2 positions shown · non-contrast
Comparison: CT of the abdomen and pelvis on 07/16/2012

CLINICAL DATA: Irregular heart rhythm noted on recent checkup.
Patient stopped smoking 3 months ago.

EXAM:
CHEST  2 VIEW

[w chest pa]
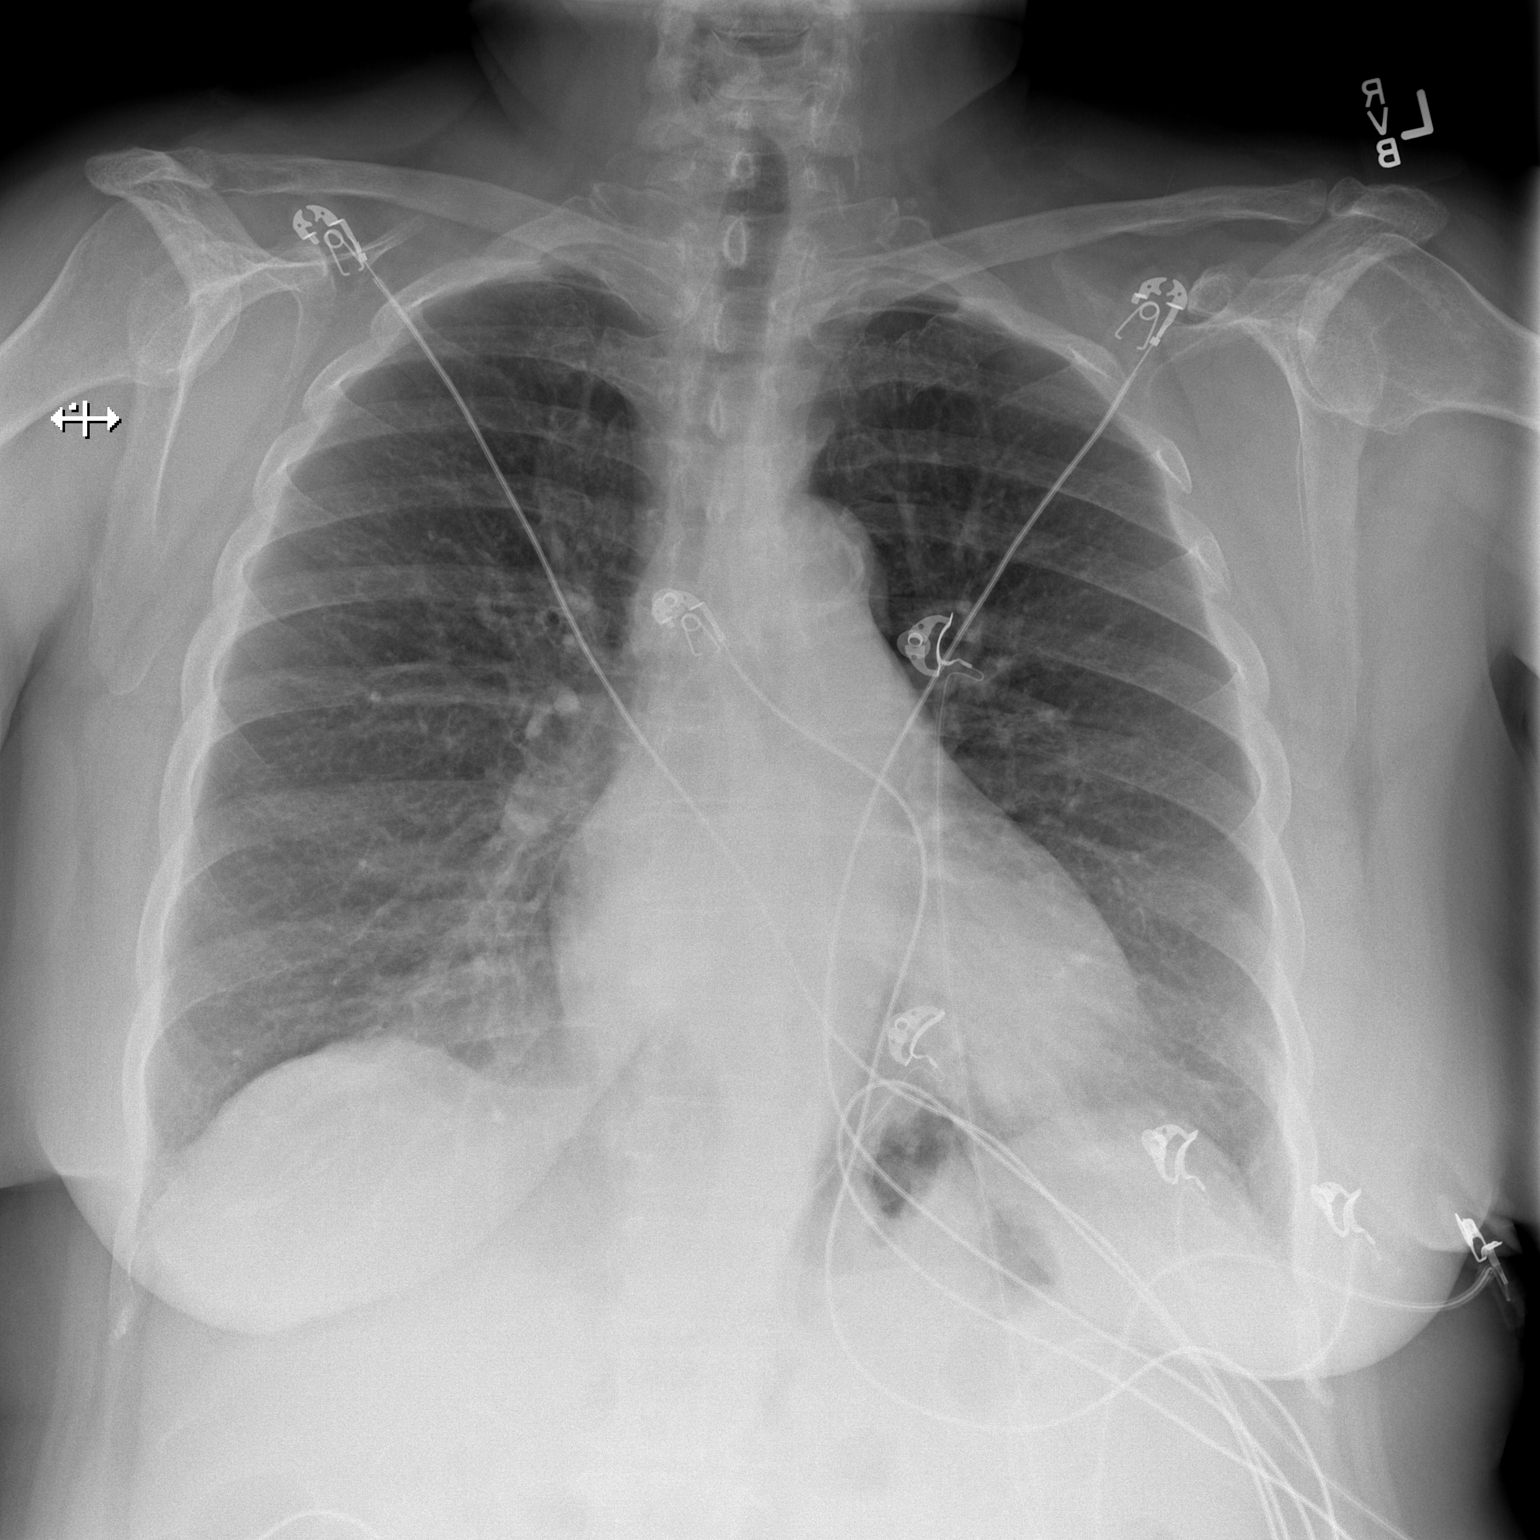

[w chest lat]
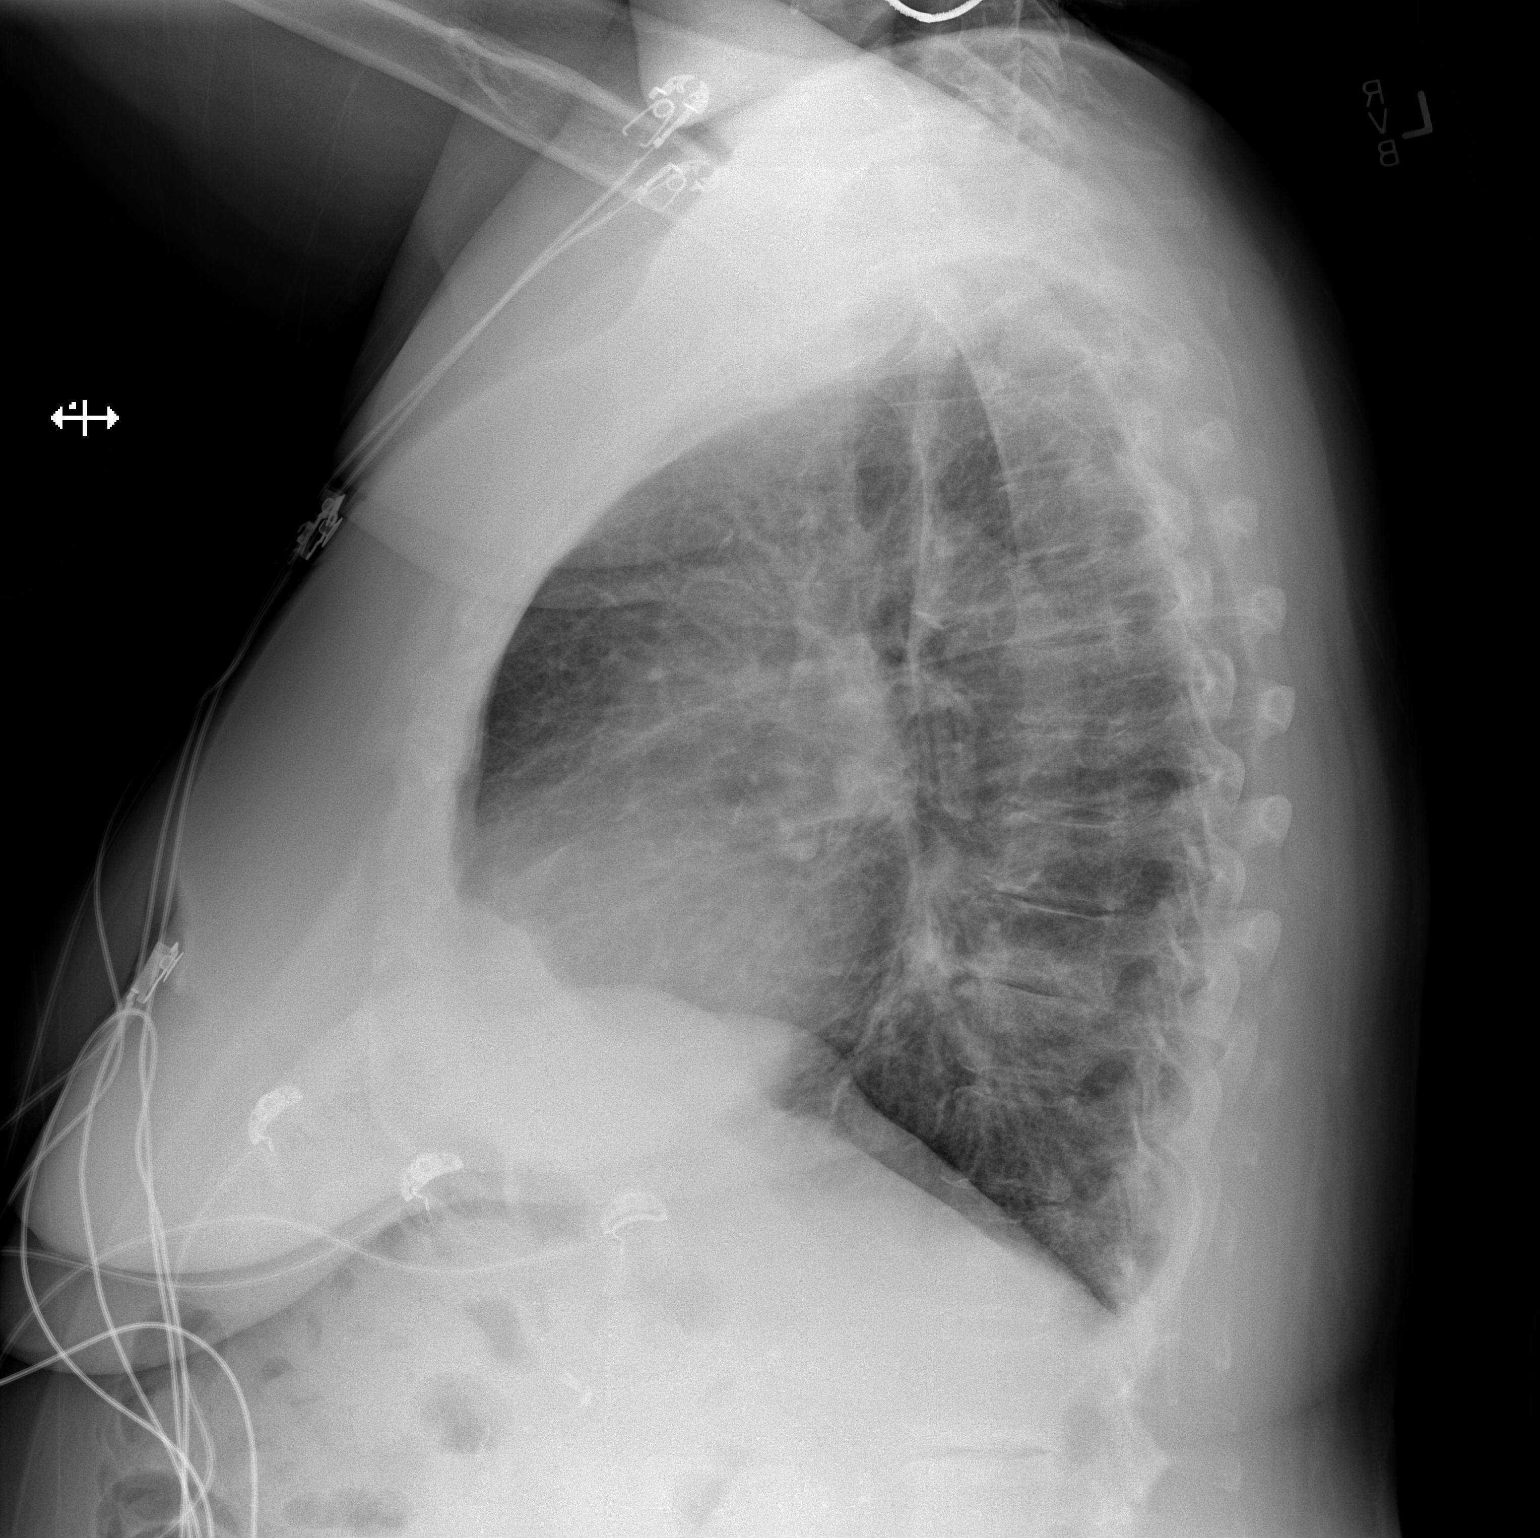

[2 of 2 positions shown; findings below may reference images not displayed]

FINDINGS: Heart size is upper limits normal. There are no focal consolidations
or pleural effusions. No pulmonary edema. There is atherosclerotic
calcification of the thoracic aorta.
IMPRESSION: No evidence for acute  abnormality.

## 2016-06-18 ENCOUNTER — Other Ambulatory Visit (HOSPITAL_COMMUNITY): Payer: Self-pay | Admitting: Nurse Practitioner

## 2016-07-30 ENCOUNTER — Other Ambulatory Visit: Payer: Self-pay | Admitting: Family Medicine

## 2016-07-30 DIAGNOSIS — Z1231 Encounter for screening mammogram for malignant neoplasm of breast: Secondary | ICD-10-CM

## 2016-08-07 ENCOUNTER — Inpatient Hospital Stay (HOSPITAL_COMMUNITY): Admission: RE | Admit: 2016-08-07 | Payer: Medicare Other | Source: Ambulatory Visit | Admitting: Nurse Practitioner

## 2016-08-10 ENCOUNTER — Ambulatory Visit: Payer: Medicare Other

## 2016-09-03 ENCOUNTER — Other Ambulatory Visit (HOSPITAL_COMMUNITY): Payer: Self-pay | Admitting: Nurse Practitioner

## 2016-09-04 ENCOUNTER — Ambulatory Visit (HOSPITAL_COMMUNITY): Payer: Medicare Other | Admitting: Nurse Practitioner

## 2016-10-17 ENCOUNTER — Ambulatory Visit (HOSPITAL_COMMUNITY): Payer: Medicare Other | Admitting: Nurse Practitioner

## 2016-10-20 ENCOUNTER — Other Ambulatory Visit (HOSPITAL_COMMUNITY): Payer: Self-pay | Admitting: Nurse Practitioner

## 2017-02-16 ENCOUNTER — Other Ambulatory Visit (HOSPITAL_COMMUNITY): Payer: Self-pay | Admitting: Nurse Practitioner

## 2017-02-18 NOTE — Telephone Encounter (Signed)
Pt last few appt have been canceled or no showed.  I LMOM for pt to clbk to sched appt and to make sure pt is still taking this medication

## 2017-02-21 ENCOUNTER — Other Ambulatory Visit (HOSPITAL_COMMUNITY): Payer: Self-pay | Admitting: *Deleted

## 2017-02-21 MED ORDER — CARTIA XT 180 MG PO CP24: 180.0000 mg | ORAL_CAPSULE | Freq: Every day | ORAL | 0 refills | Status: DC

## 2017-03-28 ENCOUNTER — Other Ambulatory Visit (HOSPITAL_COMMUNITY): Payer: Self-pay | Admitting: Nurse Practitioner

## 2017-03-29 ENCOUNTER — Encounter (HOSPITAL_COMMUNITY): Payer: Self-pay | Admitting: Nurse Practitioner

## 2017-03-29 ENCOUNTER — Ambulatory Visit (HOSPITAL_COMMUNITY)
Admission: RE | Admit: 2017-03-29 | Discharge: 2017-03-29 | Disposition: A | Discharge status: Home / Self Care | Payer: Medicare Other | Source: Ambulatory Visit | Attending: Nurse Practitioner | Admitting: Nurse Practitioner

## 2017-03-29 VITALS — BP 118/72 | HR 167 | Ht 61.0 in | Wt 240.0 lb

## 2017-03-29 DIAGNOSIS — Z86010 Personal history of colonic polyps: Secondary | ICD-10-CM | POA: Insufficient documentation

## 2017-03-29 DIAGNOSIS — G8929 Other chronic pain: Secondary | ICD-10-CM | POA: Diagnosis not present

## 2017-03-29 DIAGNOSIS — I129 Hypertensive chronic kidney disease with stage 1 through stage 4 chronic kidney disease, or unspecified chronic kidney disease: Secondary | ICD-10-CM | POA: Diagnosis not present

## 2017-03-29 DIAGNOSIS — E785 Hyperlipidemia, unspecified: Secondary | ICD-10-CM | POA: Insufficient documentation

## 2017-03-29 DIAGNOSIS — I4892 Unspecified atrial flutter: Secondary | ICD-10-CM | POA: Insufficient documentation

## 2017-03-29 DIAGNOSIS — I4891 Unspecified atrial fibrillation: Secondary | ICD-10-CM | POA: Diagnosis present

## 2017-03-29 DIAGNOSIS — F17210 Nicotine dependence, cigarettes, uncomplicated: Secondary | ICD-10-CM | POA: Diagnosis not present

## 2017-03-29 DIAGNOSIS — M1990 Unspecified osteoarthritis, unspecified site: Secondary | ICD-10-CM | POA: Diagnosis not present

## 2017-03-29 DIAGNOSIS — Z7901 Long term (current) use of anticoagulants: Secondary | ICD-10-CM | POA: Diagnosis not present

## 2017-03-29 DIAGNOSIS — N183 Chronic kidney disease, stage 3 (moderate): Secondary | ICD-10-CM | POA: Diagnosis not present

## 2017-03-29 DIAGNOSIS — G4733 Obstructive sleep apnea (adult) (pediatric): Secondary | ICD-10-CM | POA: Insufficient documentation

## 2017-03-29 DIAGNOSIS — I480 Paroxysmal atrial fibrillation: Secondary | ICD-10-CM

## 2017-03-29 DIAGNOSIS — Z79899 Other long term (current) drug therapy: Secondary | ICD-10-CM | POA: Insufficient documentation

## 2017-03-29 MED ORDER — CARTIA XT 180 MG PO CP24: 180.0000 mg | ORAL_CAPSULE | Freq: Every day | ORAL | 6 refills | Status: DC

## 2017-03-29 MED ORDER — METOPROLOL SUCCINATE ER 25 MG PO TB24: 25.0000 mg | ORAL_TABLET | Freq: Every day | ORAL | 6 refills | Status: DC

## 2017-03-29 NOTE — Progress Notes (Signed)
Patient ID: Hannah FleetNancy [name], female   DOB: 12-25-1940, 77 y.o.   MRN: [id number]     Primary Care Physician: Hannah [name], Hannah Trang, MD Referring Physician: Dr. Noemi [name]   Hannah [name] is a 77 y.o. female with a h/o afib/flutter for f/u. She presents in afib with RVR that she is unaware of. I have not seen for a year for pt cancelling appointments. Her Cardizem refill is at drug store but she has been off a few days as  well did not take her metoprolol this am. She also says that she has not had refills recently of eliquis [facility] so she has only been taking one a day. Otherwise, no change in her health.  Today, she denies symptoms of palpitations, chest pain, shortness of breath, orthopnea, PND, lower extremity edema, dizziness, presyncope, syncope, or neurologic sequela. The patient is tolerating medications without difficulties and is otherwise without complaint today.   Past Medical History:  Diagnosis Date  . A-fib (HCC)   . CKD (chronic kidney disease) stage 3, GFR 30-59 ml/min   . Colon polyps   . Diverticulosis   . DJD (degenerative joint disease)   . HLD (hyperlipidemia)    no medication  . Hx of cardiovascular stress test    Myoview 3/15: No ischemia EF 58%, low risk  . Hx of echocardiogram    Echo 4/16: EF 55-60%, no RWMA, mild BAE, atrial lipomatous hypertrophy, trivial MR, trivial TR  . Hypertension   . Intracranial hemorrhage (HCC)   . Microscopic hematuria   . OSA (obstructive sleep apnea)    uses CPAP occasionally  . Spinal stenosis    chronic pain  . Subdural hematoma (HCC)    1987 - [facility] [place]   Past Surgical History:  Procedure Laterality Date  . CARDIOVERSION N/A 01/18/2015   Procedure: CARDIOVERSION;  Surgeon: Chrystie NoseKenneth C Hilty, MD;  [facility];  Service: Cardiovascular;  Laterality: N/A;  . CHOLECYSTECTOMY    . TEE WITHOUT CARDIOVERSION N/A 01/18/2015   Procedure: TRANSESOPHAGEAL ECHOCARDIOGRAM (TEE);  Surgeon: Chrystie NoseKenneth C Hilty, MD;   [facility];  Service: Cardiovascular;  Laterality: N/A;    Current Outpatient Medications  Medication Sig Dispense Refill  . acetaminophen-codeine (TYLENOL #3) 300-30 MG tablet TAKE 1 TABLET BY MOUTH EVERY 8 HOURS AS NEEDED FOR MODERATE PAIN 30 tablet 0  . ALPRAZolam (XANAX) 0.25 MG tablet Take 1 tablet by mouth At bedtime as needed for sleep.     Marland Kitchen. apixaban (ELIQUIS) 5 MG TABS tablet Take 1 tablet (5 mg total) by mouth 2 (two) times daily. 180 tablet 2  . CARTIA XT 180 MG 24 hr capsule Take 1 capsule (180 mg total) by mouth daily. 30 capsule 0  . diltiazem (CARDIZEM) 30 MG tablet TAKE 1 TABLET BY MOUTH EVERY 4 HOURS AS NEEDED FOR FOR AFIB.(HEART RATE GREATER THEN 100 AND BLOOD PRESSURE GREATER THEN 100) 45 tablet 3  . ELIQUIS 5 MG TABS tablet TAKE ONE TABLET BY MOUTH TWICE DAILY 180 tablet 0  . hydrochlorothiazide (HYDRODIURIL) 25 MG tablet Take 12.5 mg by mouth Daily.     . metoprolol succinate (TOPROL-XL) 25 MG 24 hr tablet TAKE 1 TABLET BY MOUTH EVERY NIGHT AT BEDTIME 30 tablet 1   No current facility-administered medications for this encounter.     No Known Allergies  Social History   Socioeconomic History  . Marital status: Divorced    Spouse name: Not on file  . Number of children: Not on file  .  Years of education: Not on file  . Highest education level: Not on file  Social Needs  . Financial resource strain: Not on file  . Food insecurity - worry: Not on file  . Food insecurity - inability: Not on file  . Transportation needs - medical: Not on file  . Transportation needs - non-medical: Not on file  Occupational History  . Not on file  Tobacco Use  . Smoking status: Current Every Day Smoker    Types: Cigarettes  . Smokeless tobacco: Never Used  . Tobacco comment: 4 cigarettes per day  Substance and Sexual Activity  . Alcohol use: Yes    Alcohol/week: 0.6 oz    Types: 1 Standard drinks or equivalent per week    Comment: twice a week  . Drug use: No  .  Sexual activity: Not on file  Other Topics Concern  . Not on file  Social History Narrative  . Not on file    Family History  Problem Relation Age of Onset  . Cancer Mother   . Heart attack Mother   . Heart disease Mother   . Hypertension Sister   . Other Sister        subdural hematoma - brother and sister died  . Heart attack Father   . Suicidality Father   . Stroke Sister   . Diabetes Sister     ROS- All systems are reviewed and negative except as per the HPI above  Physical Exam: There were no vitals filed for this visit.  GEN- The patient is well appearing, alert and oriented x 3 today.   Head- normocephalic, atraumatic Eyes-  Sclera clear, conjunctiva pink Ears- hearing intact Oropharynx- clear Neck- supple, no JVP Lymph- no cervical lymphadenopathy Lungs- Clear to ausculation bilaterally, normal work of breathing Heart- Regular rate and rhythm, no murmurs, rubs or gallops, PMI not laterally displaced GI- soft, NT, ND, + BS Extremities- no clubbing, cyanosis, or edema MS- no significant deformity or atrophy Skin- no rash or lesion Psych- euthymic mood, full affect Neuro- strength and sensation are intact  EKG- afib at 167 bpm,  qrs int 70 ms, qtc 420 ms Epic records reviewed   Assessment and Plan: 1. Afib/flutter  Has not been in clinc for a year 2/2 cancelled appointment and is in RVR today, asymptomatic Go home and take metoprolol/diltiazem this am Continue apixaban 5 mg bid for chadsvasc score of 3, pt informed that taking eliquis only one time a day is putting herself for risk of stroke, states that she was getting delivered to her house but drug stopped coming( probably has failed to f/u with appointment, said she had been avoiding all doctors) [facility] drug [facility]  2. HTN Stable   3. Sleep apnea Untreated States cannot tolerate cpap   F/u in one week with EKG and   bmet After that f/u in 6 months  Lupita Leash C. [facility] [address] [place], [place] [address] [phone]

## 2017-03-29 NOTE — Progress Notes (Signed)
error 

## 2017-04-04 ENCOUNTER — Encounter (HOSPITAL_COMMUNITY): Payer: Medicare Other | Admitting: Nurse Practitioner

## 2017-04-09 ENCOUNTER — Encounter (HOSPITAL_COMMUNITY): Payer: Medicare Other | Admitting: Nurse Practitioner

## 2017-04-11 ENCOUNTER — Ambulatory Visit (HOSPITAL_COMMUNITY)
Admission: RE | Admit: 2017-04-11 | Discharge: 2017-04-11 | Disposition: A | Discharge status: Home / Self Care | Payer: Medicare Other | Source: Ambulatory Visit | Attending: Nurse Practitioner | Admitting: Nurse Practitioner

## 2017-04-11 DIAGNOSIS — R9431 Abnormal electrocardiogram [ECG] [EKG]: Secondary | ICD-10-CM | POA: Insufficient documentation

## 2017-04-11 DIAGNOSIS — I4891 Unspecified atrial fibrillation: Secondary | ICD-10-CM | POA: Insufficient documentation

## 2017-04-11 MED ORDER — APIXABAN 5 MG PO TABS: 5.0000 mg | ORAL_TABLET | Freq: Two times a day (BID) | ORAL | 2 refills | Status: DC

## 2017-04-11 MED ORDER — CARTIA XT 180 MG PO CP24: 180.0000 mg | ORAL_CAPSULE | Freq: Every day | ORAL | 1 refills | Status: DC

## 2017-04-11 MED ORDER — METOPROLOL SUCCINATE ER 25 MG PO TB24: 25.0000 mg | ORAL_TABLET | Freq: Every day | ORAL | 1 refills | Status: DC

## 2017-04-11 NOTE — Progress Notes (Addendum)
Pt in for repeat EKG; to be reviewed by Rudi Cocoonna Verlyn Lambert, NP  Pt continues in afib but at 111 bpm, qrs int 80 ms, qtc 476 ms,but rate is better.from last visit. Increase  Metoprolol to 1/12 tab a day and f/u her in 10 days. Pt is getting over URI and may be driving afib.

## 2017-04-11 NOTE — Patient Instructions (Signed)
Increase Metoprolol to 37.5 mg (1.5 tablets) daily and return for EKG in 10 days

## 2017-04-18 ENCOUNTER — Other Ambulatory Visit (HOSPITAL_COMMUNITY): Payer: Self-pay | Admitting: *Deleted

## 2017-04-18 MED ORDER — METOPROLOL SUCCINATE ER 25 MG PO TB24: 25.0000 mg | ORAL_TABLET | Freq: Every day | ORAL | 1 refills | Status: DC

## 2017-04-18 MED ORDER — APIXABAN 5 MG PO TABS: 5.0000 mg | ORAL_TABLET | Freq: Two times a day (BID) | ORAL | 2 refills | Status: DC

## 2017-04-18 MED ORDER — CARTIA XT 180 MG PO CP24: 180.0000 mg | ORAL_CAPSULE | Freq: Every day | ORAL | 1 refills | Status: DC

## 2017-04-18 MED ORDER — DILTIAZEM HCL 30 MG PO TABS: ORAL_TABLET | ORAL | 3 refills | Status: DC

## 2017-04-22 ENCOUNTER — Encounter (HOSPITAL_COMMUNITY): Payer: Medicare Other | Admitting: Nurse Practitioner

## 2017-04-25 ENCOUNTER — Ambulatory Visit (HOSPITAL_COMMUNITY)
Admission: RE | Admit: 2017-04-25 | Discharge: 2017-04-25 | Disposition: A | Discharge status: Home / Self Care | Payer: Medicare Other | Source: Ambulatory Visit | Attending: Nurse Practitioner | Admitting: Nurse Practitioner

## 2017-04-25 DIAGNOSIS — Z79899 Other long term (current) drug therapy: Secondary | ICD-10-CM | POA: Insufficient documentation

## 2017-04-25 DIAGNOSIS — I4891 Unspecified atrial fibrillation: Secondary | ICD-10-CM | POA: Insufficient documentation

## 2017-04-25 MED ORDER — HYDROCHLOROTHIAZIDE 25 MG PO TABS: 12.5000 mg | ORAL_TABLET | Freq: Every day | ORAL | 3 refills | Status: DC

## 2017-04-25 NOTE — Patient Instructions (Signed)
Restart metoprolol

## 2017-04-25 NOTE — Progress Notes (Addendum)
Pt in for repeat EKG. To be reviewed by Rudi Cocoonna Leatrice Parilla, NP;  Pt reports being out of metoprolol for a couple of days and so she was taking the cardizem 30 mg instead  Pt with RVR but has been out of metoprolol, as it did not come [facility] in a timely manner. She has a RX pending pick  up at the drugstore. She will get on BB and will return in 2 weeks for repeat EKG to assess rate control..Marland Kitchen

## 2017-05-09 ENCOUNTER — Encounter (HOSPITAL_COMMUNITY): Payer: Medicare Other | Admitting: Nurse Practitioner

## 2017-05-20 IMAGING — US US EXTREM LOW VENOUS*L*
1 series · 13 of 24 positions shown · non-contrast
Comparison: None.

CLINICAL DATA: Lower extremity pain and edema

EXAM:
LEFT LOWER EXTREMITY VENOUS DUPLEX ULTRASOUND
TECHNIQUE: Gray-scale sonography with graded compression, as well as color
Doppler and duplex ultrasound were performed to evaluate the left
lower extremity deep venous system from the level of the common
femoral vein and including the common femoral, femoral, profunda
femoral, popliteal and calf veins including the posterior tibial,
peroneal and gastrocnemius veins when visible. The superficial great
saphenous vein was also interrogated. Spectral Doppler was utilized
to evaluate flow at rest and with distal augmentation maneuvers in
the common femoral, femoral and popliteal veins.

[Series 1: us extrem low venous*left* · 13 of 29 slices shown]
[im 1/29]
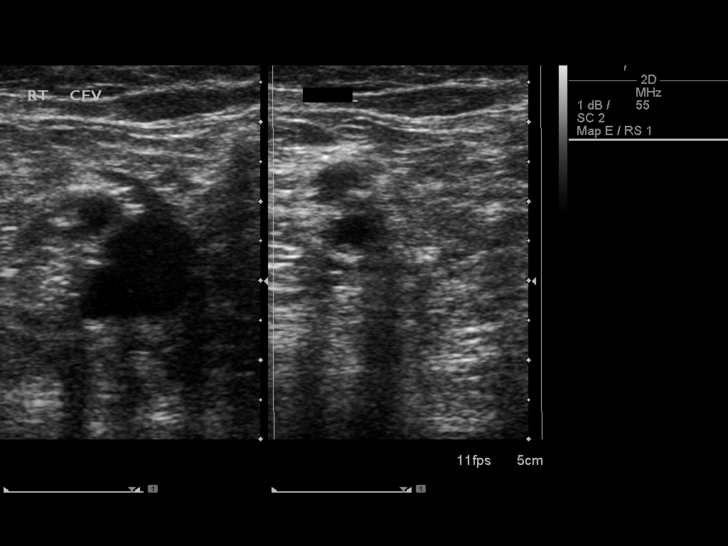
[im 3/29]
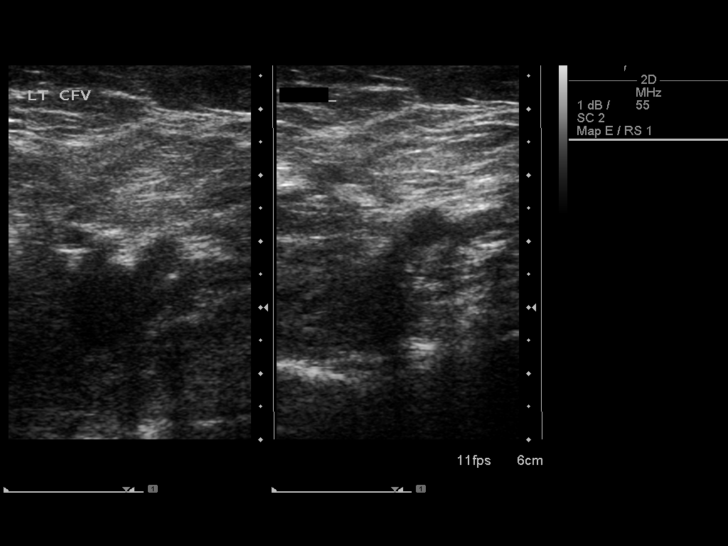
[im 5/29]
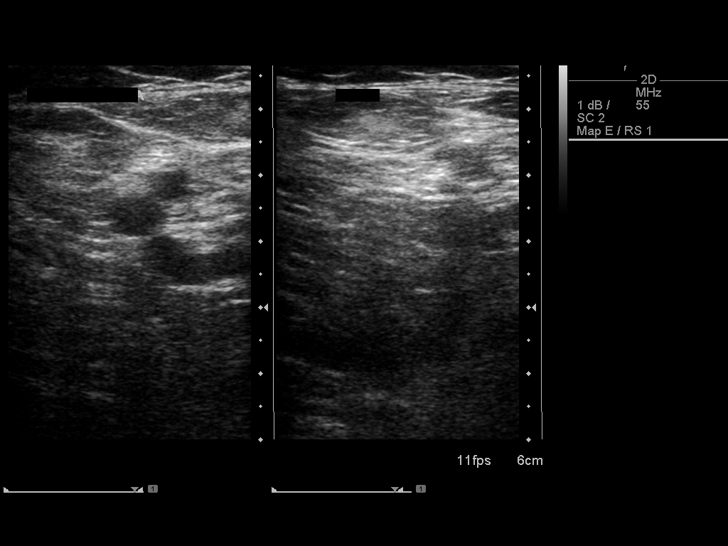
[im 8/29]
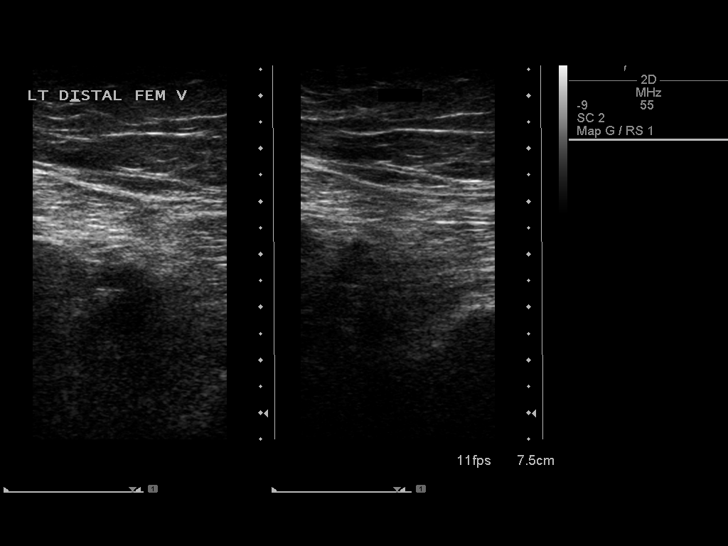
[im 10/29]
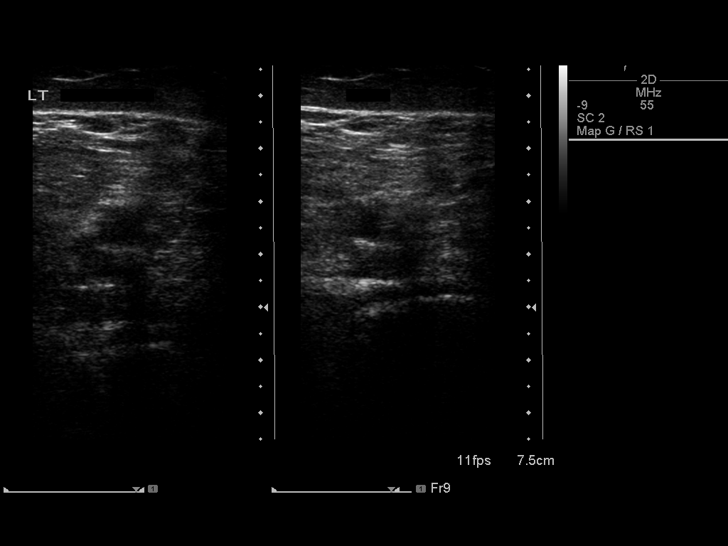
[im 13/29]
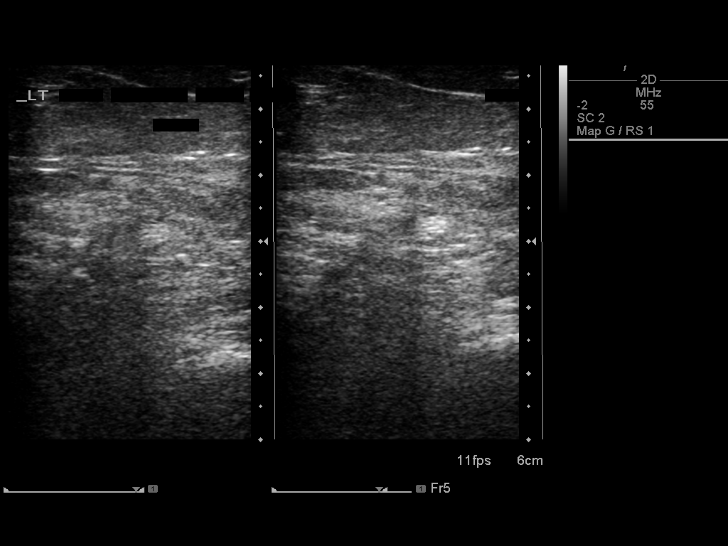
[im 15/29]
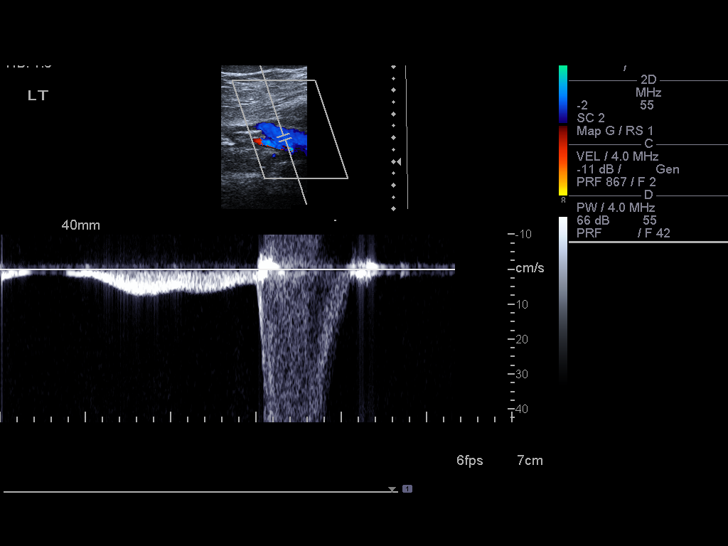
[im 16/29]
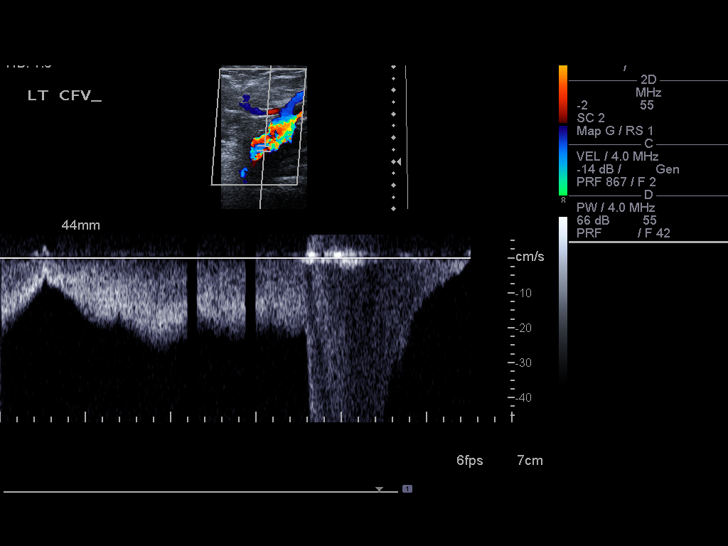
[im 19/29]
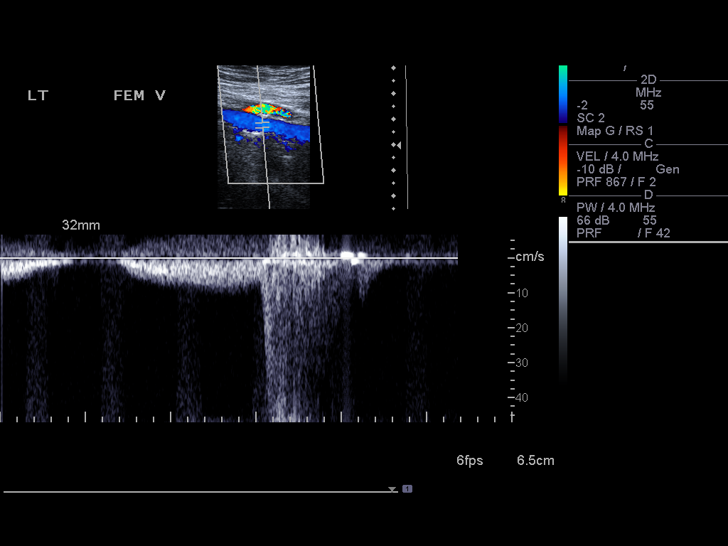
[im 21/29]
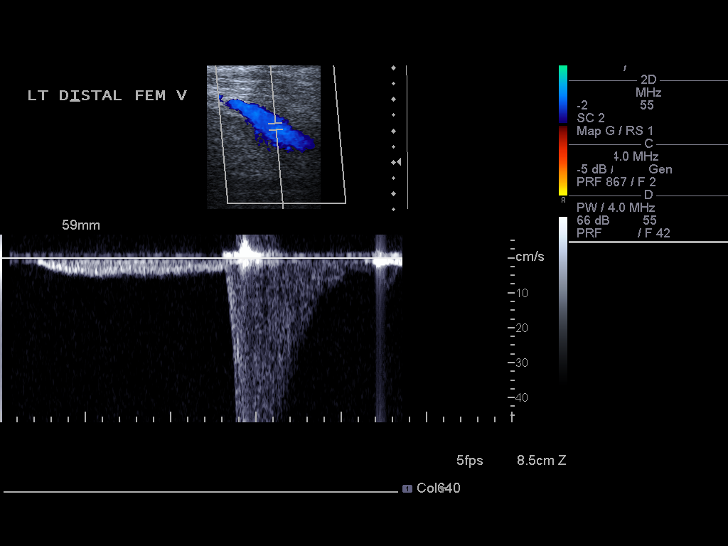
[im 24/29]
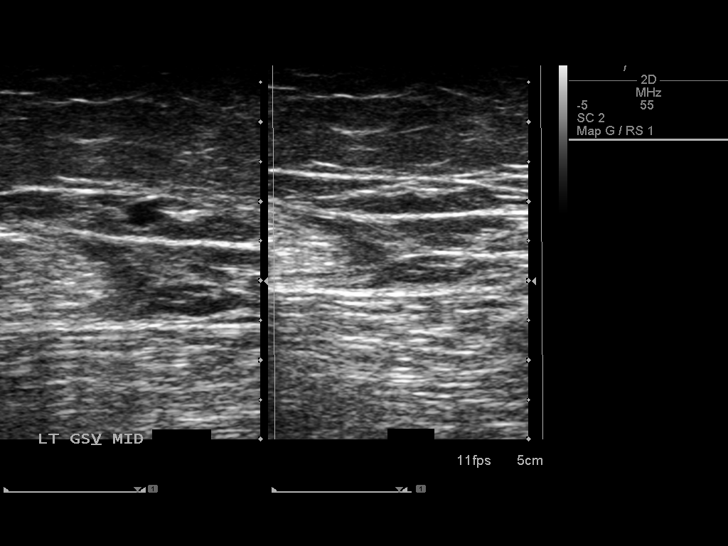
[im 26/29]
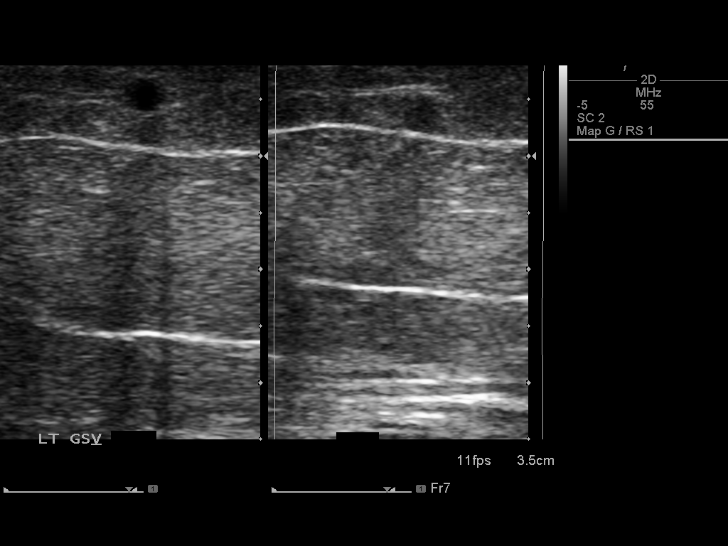
[im 29/29]
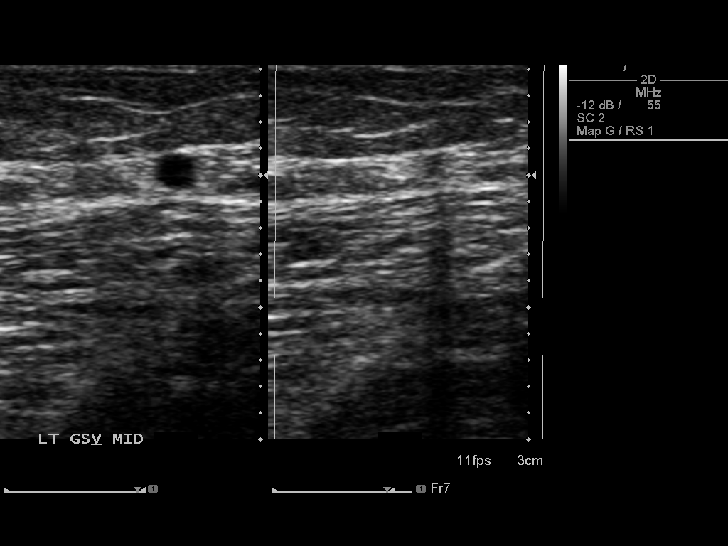

[13 of 24 positions shown; findings below may reference images not displayed]

FINDINGS: Contralateral Common Femoral Vein: Respiratory phasicity is normal
and symmetric with the symptomatic side. No evidence of thrombus.
Normal compressibility.

Common Femoral Vein: No evidence of thrombus. Normal
compressibility, respiratory phasicity and response to augmentation.

Saphenofemoral Junction: No evidence of thrombus. Normal
compressibility and flow on color Doppler imaging.

Profunda Femoral Vein: No evidence of thrombus. Normal
compressibility and flow on color Doppler imaging.

Femoral Vein: No evidence of thrombus. Normal compressibility,
respiratory phasicity and response to augmentation.

Popliteal Vein: No evidence of thrombus. Normal compressibility,
respiratory phasicity and response to augmentation.

Calf Veins: No evidence of thrombus in visualized regions. Normal
compressibility and flow on color Doppler imaging. Note that the
left peroneal vein is not appreciable.

Superficial Great Saphenous Vein: No evidence of thrombus. Normal
compressibility and flow on color Doppler imaging.

Venous Reflux:  None.

Other Findings:  None.
IMPRESSION: No evidence of left lower extremity deep venous thrombosis in
visualized regions. Left peroneal vein could not be visualized.
Right common femoral vein also patent.

## 2017-08-08 ENCOUNTER — Ambulatory Visit (HOSPITAL_COMMUNITY): Payer: Medicare Other | Admitting: Nurse Practitioner

## 2017-08-25 ENCOUNTER — Emergency Department (HOSPITAL_COMMUNITY): Payer: Medicare Other

## 2017-08-25 ENCOUNTER — Emergency Department (HOSPITAL_COMMUNITY)
Admission: EM | Admit: 2017-08-25 | Discharge: 2017-08-25 | Disposition: A | Discharge status: Home / Self Care | Payer: Medicare Other | Attending: Emergency Medicine | Admitting: Emergency Medicine

## 2017-08-25 ENCOUNTER — Encounter (HOSPITAL_COMMUNITY): Payer: Self-pay | Admitting: Emergency Medicine

## 2017-08-25 ENCOUNTER — Other Ambulatory Visit: Payer: Self-pay

## 2017-08-25 DIAGNOSIS — N183 Chronic kidney disease, stage 3 (moderate): Secondary | ICD-10-CM | POA: Diagnosis not present

## 2017-08-25 DIAGNOSIS — Z79899 Other long term (current) drug therapy: Secondary | ICD-10-CM | POA: Insufficient documentation

## 2017-08-25 DIAGNOSIS — Z7901 Long term (current) use of anticoagulants: Secondary | ICD-10-CM | POA: Diagnosis not present

## 2017-08-25 DIAGNOSIS — F17210 Nicotine dependence, cigarettes, uncomplicated: Secondary | ICD-10-CM | POA: Diagnosis not present

## 2017-08-25 DIAGNOSIS — I129 Hypertensive chronic kidney disease with stage 1 through stage 4 chronic kidney disease, or unspecified chronic kidney disease: Secondary | ICD-10-CM | POA: Diagnosis not present

## 2017-08-25 DIAGNOSIS — M25561 Pain in right knee: Secondary | ICD-10-CM | POA: Insufficient documentation

## 2017-08-25 MED ORDER — OXYCODONE-ACETAMINOPHEN 5-325 MG PO TABS: 1.0000 | ORAL_TABLET | Freq: Four times a day (QID) | ORAL | 0 refills | Status: DC | PRN

## 2017-08-25 MED ORDER — OXYCODONE-ACETAMINOPHEN 5-325 MG PO TABS
1.0000 | ORAL_TABLET | Freq: Once | ORAL | Status: AC
  Administered 2017-08-25: 1 via ORAL
  Filled 2017-08-25: qty 1
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
## 2017-08-25 NOTE — ED Triage Notes (Signed)
Pt BIB EMS from independent living ([facility]). Patient was at a wedding and while attempting to get into a golf cart felt her right knee pop. No swelling or deformity noted. Patient unable to bear weight on leg but is able to move it with minimal pain. Patient hx of afib. Swelling in feet noted. Patient states it is not unusual when she has her legs in a dependent position.

## 2017-08-25 NOTE — Discharge Instructions (Signed)
Apply ice to areas of injury 3-4 times per day to limit inflammation.  Use a walker when walking for added stability.  Wear a knee immobilizer for comfort.  You may remove this to shower as well as while sleeping.  We recommend consistent use of naproxen or ibuprofen for management of pain and inflammation.  You have been prescribed Percocet to take as needed for severe pain.  Do not drive or drink alcohol after taking this medication as it may make you drowsy and impair your judgment.  We recommend follow-up with an orthopedic doctor to ensure resolution of symptoms.  You may follow-up with your primary care doctor as needed.  Return to the ED for any new or concerning symptoms.

## 2017-09-11 ENCOUNTER — Ambulatory Visit (HOSPITAL_COMMUNITY): Payer: Medicare Other | Admitting: Nurse Practitioner

## 2017-09-19 ENCOUNTER — Ambulatory Visit (HOSPITAL_COMMUNITY): Payer: Medicare Other | Admitting: Nurse Practitioner

## 2017-10-09 ENCOUNTER — Other Ambulatory Visit: Payer: Self-pay | Admitting: Ophthalmology

## 2017-10-09 DIAGNOSIS — H4901 Third [oculomotor] nerve palsy, right eye: Secondary | ICD-10-CM

## 2017-10-16 ENCOUNTER — Ambulatory Visit
Admission: RE | Admit: 2017-10-16 | Discharge: 2017-10-16 | Disposition: A | Discharge status: Home / Self Care | Payer: Non-veteran care | Source: Ambulatory Visit | Attending: Ophthalmology | Admitting: Ophthalmology

## 2017-10-16 DIAGNOSIS — H4901 Third [oculomotor] nerve palsy, right eye: Secondary | ICD-10-CM

## 2017-10-16 MED ORDER — IOPAMIDOL (ISOVUE-300) INJECTION 61%
75.0000 mL | Freq: Once | INTRAVENOUS | Status: AC | PRN
  Administered 2017-10-16: 75 mL via INTRAVENOUS

## 2018-02-05 ENCOUNTER — Ambulatory Visit (HOSPITAL_COMMUNITY): Payer: Non-veteran care | Admitting: Nurse Practitioner

## 2018-02-13 ENCOUNTER — Ambulatory Visit (HOSPITAL_COMMUNITY): Payer: Non-veteran care | Admitting: Nurse Practitioner

## 2018-03-03 ENCOUNTER — Ambulatory Visit (HOSPITAL_COMMUNITY)
Admission: RE | Admit: 2018-03-03 | Discharge: 2018-03-03 | Disposition: A | Discharge status: Home / Self Care | Payer: Medicare Other | Source: Ambulatory Visit | Attending: Nurse Practitioner | Admitting: Nurse Practitioner

## 2018-03-03 ENCOUNTER — Encounter (HOSPITAL_COMMUNITY): Payer: Self-pay | Admitting: Nurse Practitioner

## 2018-03-03 VITALS — BP 146/84 | HR 108 | Ht 61.0 in | Wt 244.0 lb

## 2018-03-03 DIAGNOSIS — I4819 Other persistent atrial fibrillation: Secondary | ICD-10-CM | POA: Diagnosis present

## 2018-03-03 DIAGNOSIS — I4892 Unspecified atrial flutter: Secondary | ICD-10-CM | POA: Insufficient documentation

## 2018-03-03 DIAGNOSIS — G4733 Obstructive sleep apnea (adult) (pediatric): Secondary | ICD-10-CM | POA: Diagnosis not present

## 2018-03-03 DIAGNOSIS — G4730 Sleep apnea, unspecified: Secondary | ICD-10-CM | POA: Insufficient documentation

## 2018-03-03 DIAGNOSIS — Z833 Family history of diabetes mellitus: Secondary | ICD-10-CM | POA: Diagnosis not present

## 2018-03-03 DIAGNOSIS — N183 Chronic kidney disease, stage 3 (moderate): Secondary | ICD-10-CM | POA: Insufficient documentation

## 2018-03-03 DIAGNOSIS — F17210 Nicotine dependence, cigarettes, uncomplicated: Secondary | ICD-10-CM | POA: Insufficient documentation

## 2018-03-03 DIAGNOSIS — Z79899 Other long term (current) drug therapy: Secondary | ICD-10-CM | POA: Diagnosis not present

## 2018-03-03 DIAGNOSIS — Z9049 Acquired absence of other specified parts of digestive tract: Secondary | ICD-10-CM | POA: Diagnosis not present

## 2018-03-03 DIAGNOSIS — R9431 Abnormal electrocardiogram [ECG] [EKG]: Secondary | ICD-10-CM | POA: Diagnosis not present

## 2018-03-03 DIAGNOSIS — Z8249 Family history of ischemic heart disease and other diseases of the circulatory system: Secondary | ICD-10-CM | POA: Insufficient documentation

## 2018-03-03 DIAGNOSIS — I480 Paroxysmal atrial fibrillation: Secondary | ICD-10-CM

## 2018-03-03 DIAGNOSIS — I129 Hypertensive chronic kidney disease with stage 1 through stage 4 chronic kidney disease, or unspecified chronic kidney disease: Secondary | ICD-10-CM | POA: Diagnosis not present

## 2018-03-03 DIAGNOSIS — Z7901 Long term (current) use of anticoagulants: Secondary | ICD-10-CM | POA: Insufficient documentation

## 2018-03-03 LAB — BASIC METABOLIC PANEL
ANION GAP: 12 (ref 5–15)
BUN: 34 mg/dL — AB (ref 8–23)
CALCIUM: 9.3 mg/dL (ref 8.9–10.3)
CO2: 22 mmol/L (ref 22–32)
Chloride: 103 mmol/L (ref 98–111)
Creatinine, Ser: 1.27 mg/dL — ABNORMAL HIGH (ref 0.44–1.00)
GFR calc Af Amer: 48 mL/min — ABNORMAL LOW (ref >60)
GFR, EST NON AFRICAN AMERICAN: 41 mL/min — AB (ref >60)
GLUCOSE: 95 mg/dL (ref 70–99)
POTASSIUM: 4.4 mmol/L (ref 3.5–5.1)
Sodium: 137 mmol/L (ref 135–145)

## 2018-03-03 LAB — CBC
HEMATOCRIT: 46.6 % — AB (ref 36.0–46.0)
Hemoglobin: 15.2 g/dL — ABNORMAL HIGH (ref 12.0–15.0)
MCH: 31 pg (ref 26.0–34.0)
MCHC: 32.6 g/dL (ref 30.0–36.0)
MCV: 94.9 fL (ref 80.0–100.0)
PLATELETS: 274 10*3/uL (ref 150–400)
RBC: 4.91 MIL/uL (ref 3.87–5.11)
RDW: 13.3 % (ref 11.5–15.5)
WBC: 8.1 10*3/uL (ref 4.0–10.5)
nRBC: 0 % (ref 0.0–0.2)

## 2018-03-03 MED ORDER — METOPROLOL SUCCINATE ER 25 MG PO TB24: 25.0000 mg | ORAL_TABLET | Freq: Two times a day (BID) | ORAL | Status: DC

## 2018-03-03 NOTE — Progress Notes (Signed)
Patient ID: Hannah FleetNancy [name], female   DOB: February 21, 1941, 78 y.o.   MRN: [id number]     Primary Care Physician: Shaune PollackGates, Netta Fodge, MD Referring Physician: Dr. Noemi ChapelAllred   Hannah [name] is a 78 y.o. female with a h/o afib/flutter for f/u. She presents in afib with RVR that she is unaware of. I have not seen for a year for pt cancelling appointments. Her Cardizem refill is at drug store but she has been off a few days as  well did not take her metoprolol this am. She also says that she has not [facility] [place] [facility] so she has only been taking one a day. Otherwise, no change in her health.  F/u in afib clinic, 03/03/2018. I have not seen for almost a year, cancels many appointments. She is in afib with v rate of 108 bpm. She is asymptomatic.  She states that she is taking eliquis without fail.  [facility] in about a year either.  Today, she denies symptoms of palpitations, chest pain, shortness of breath, orthopnea, PND, lower extremity edema, dizziness, presyncope, syncope, or neurologic sequela. The patient is tolerating medications without difficulties and is otherwise without complaint today.   Past Medical History:  Diagnosis Date  . A-fib (HCC)   . CKD (chronic kidney disease) stage 3, GFR 30-59 ml/min (HCC)   . Colon polyps   . Diverticulosis   . DJD (degenerative joint disease)   . HLD (hyperlipidemia)    no medication  . Hx of cardiovascular stress test    Myoview 3/15: No ischemia EF 58%, low risk  . Hx of echocardiogram    Echo 4/16: EF 55-60%, no RWMA, mild BAE, atrial lipomatous hypertrophy, trivial MR, trivial TR  . Hypertension   . Intracranial hemorrhage (HCC)   . Microscopic hematuria   . OSA (obstructive sleep apnea)    uses CPAP occasionally  . Spinal stenosis    chronic pain  . Subdural hematoma (HCC)    1987 - [facility] [place]   Past Surgical History:  Procedure Laterality Date  . CARDIOVERSION N/A 01/18/2015     Procedure: CARDIOVERSION;  Surgeon: Chrystie NoseKenneth C Hilty, MD;  [facility];  Service: Cardiovascular;  Laterality: N/A;  . CHOLECYSTECTOMY    . TEE WITHOUT CARDIOVERSION N/A 01/18/2015   Procedure: TRANSESOPHAGEAL ECHOCARDIOGRAM (TEE);  Surgeon: Chrystie NoseKenneth C Hilty, MD;  [facility];  Service: Cardiovascular;  Laterality: N/A;    Current Outpatient Medications  Medication Sig Dispense Refill  . ALPRAZolam (XANAX) 0.25 MG tablet Take 1 tablet by mouth At bedtime as needed for sleep.     Marland Kitchen. apixaban (ELIQUIS) 5 MG TABS tablet Take 1 tablet (5 mg total) by mouth 2 (two) times daily. 180 tablet 2  . CARTIA XT 180 MG 24 hr capsule Take 1 capsule (180 mg total) by mouth daily. 90 capsule 1  . metoprolol succinate (TOPROL-XL) 25 MG 24 hr tablet Take 1 tablet (25 mg total) by mouth 2 (two) times daily.    Marland Kitchen. oxyCODONE-acetaminophen (PERCOCET/ROXICET) 5-325 MG tablet Take 1-2 tablets by mouth every 6 (six) hours as needed for severe pain. 15 tablet 0  . sertraline (ZOLOFT) 25 MG tablet Take 1 tablet by mouth daily.    Marland Kitchen. diltiazem (CARDIZEM) 30 MG tablet TAKE 1 TABLET BY MOUTH EVERY 4 HOURS AS NEEDED FOR FOR AFIB.(HEART RATE GREATER THEN 100 AND BLOOD PRESSURE GREATER THEN 100) (Patient not taking: Reported on  03/03/2018) 45 tablet 3   No current facility-administered medications for this encounter.     No Known Allergies  Social History   Socioeconomic History  . Marital status: Divorced    Spouse name: Not on file  . Number of children: Not on file  . Years of education: Not on file  . Highest education level: Not on file  Occupational History  . Not on file  Social Needs  . Financial resource strain: Not on file  . Food insecurity:    Worry: Not on file    Inability: Not on file  . Transportation needs:    Medical: Not on file    Non-medical: Not on file  Tobacco Use  . Smoking status: Current Every Day Smoker    Types: Cigarettes  . Smokeless tobacco: Never Used  .  Tobacco comment: 4 cigarettes per day  Substance and Sexual Activity  . Alcohol use: Yes    Alcohol/week: 1.0 standard drinks    Types: 1 Standard drinks or equivalent per week    Comment: twice a week  . Drug use: No  . Sexual activity: Not on file  Lifestyle  . Physical activity:    Days per week: Not on file    Minutes per session: Not on file  . Stress: Not on file  Relationships  . Social connections:    Talks on phone: Not on file    Gets together: Not on file    Attends religious service: Not on file    Active member of club or organization: Not on file    Attends meetings of clubs or organizations: Not on file    Relationship status: Not on file  . Intimate partner violence:    Fear of current or ex partner: Not on file    Emotionally abused: Not on file    Physically abused: Not on file    Forced sexual activity: Not on file  Other Topics Concern  . Not on file  Social History Narrative  . Not on file    Family History  Problem Relation Age of Onset  . Cancer Mother   . Heart attack Mother   . Heart disease Mother   . Hypertension Sister   . Heart attack Father   . Suicidality Father   . Other Sister        subdural hematoma - brother and sister died  . Stroke Sister   . Diabetes Sister     ROS- All systems are reviewed and negative except as per the HPI above  Physical Exam: Vitals:   03/03/18 1543  BP: (!) 146/84  Pulse: (!) 108  Weight: 110.7 kg  Height: 5\' 1"  (1.549 m)    GEN- The patient is well appearing, alert and oriented x 3 today.   Head- normocephalic, atraumatic Eyes-  Sclera clear, conjunctiva pink Ears- hearing intact Oropharynx- clear Neck- supple, no JVP Lymph- no cervical lymphadenopathy Lungs- Clear to ausculation bilaterally, normal work of breathing Heart- irregular rate and rhythm, no murmurs, rubs or gallops, PMI not laterally displaced GI- soft, NT, ND, + BS Extremities- no clubbing, cyanosis, or edema MS- no  significant deformity or atrophy Skin- no rash or lesion Psych- euthymic mood, full affect Neuro- strength and sensation are intact  EKG- afib at 108 bpm,  qrs int 70 ms, qtc 420 ms Epic records reviewed   Assessment and Plan: 1. Afib/flutter  Appears to be in persistent afib, asymptomatic Increase Toprol to 25 mg bid  for better rate control Continue eliquis 5 mg bid, chadsvasc score of at least 4 Cbc/bmet  2. HTN Mildly elevated  Increase in BB will help Avoid salt  3. Sleep apnea Untreated States cannot tolerate cpap   F/u in 3 months  Lupita Leash C. [facility] [address] [place], [place] [address] [phone]

## 2018-03-03 NOTE — Patient Instructions (Signed)
Metoprolol 1 tablet twice a day

## 2018-04-07 LAB — HM COLONOSCOPY

## 2018-06-11 ENCOUNTER — Other Ambulatory Visit (HOSPITAL_COMMUNITY): Payer: Self-pay | Admitting: *Deleted

## 2018-06-11 MED ORDER — METOPROLOL SUCCINATE ER 25 MG PO TB24: 25.0000 mg | ORAL_TABLET | Freq: Two times a day (BID) | ORAL | 1 refills | Status: DC

## 2018-07-15 ENCOUNTER — Other Ambulatory Visit (HOSPITAL_COMMUNITY): Payer: Self-pay | Admitting: *Deleted

## 2018-07-15 MED ORDER — APIXABAN 5 MG PO TABS: 5.0000 mg | ORAL_TABLET | Freq: Two times a day (BID) | ORAL | 0 refills | Status: DC

## 2018-10-02 ENCOUNTER — Other Ambulatory Visit (HOSPITAL_COMMUNITY): Payer: Self-pay | Admitting: *Deleted

## 2018-10-02 MED ORDER — APIXABAN 5 MG PO TABS: 5.0000 mg | ORAL_TABLET | Freq: Two times a day (BID) | ORAL | 0 refills | Status: DC

## 2018-11-17 ENCOUNTER — Encounter (HOSPITAL_COMMUNITY): Payer: Self-pay | Admitting: Nurse Practitioner

## 2018-11-17 ENCOUNTER — Other Ambulatory Visit: Payer: Self-pay

## 2018-11-17 ENCOUNTER — Ambulatory Visit (HOSPITAL_COMMUNITY)
Admission: RE | Admit: 2018-11-17 | Discharge: 2018-11-17 | Disposition: A | Discharge status: Home / Self Care | Payer: Medicare Other | Source: Ambulatory Visit | Attending: Nurse Practitioner | Admitting: Nurse Practitioner

## 2018-11-17 VITALS — BP 130/82 | HR 100 | Ht 61.0 in | Wt 251.0 lb

## 2018-11-17 DIAGNOSIS — G4733 Obstructive sleep apnea (adult) (pediatric): Secondary | ICD-10-CM | POA: Diagnosis not present

## 2018-11-17 DIAGNOSIS — Z9049 Acquired absence of other specified parts of digestive tract: Secondary | ICD-10-CM | POA: Insufficient documentation

## 2018-11-17 DIAGNOSIS — N183 Chronic kidney disease, stage 3 (moderate): Secondary | ICD-10-CM | POA: Diagnosis not present

## 2018-11-17 DIAGNOSIS — F17210 Nicotine dependence, cigarettes, uncomplicated: Secondary | ICD-10-CM | POA: Diagnosis not present

## 2018-11-17 DIAGNOSIS — Z7901 Long term (current) use of anticoagulants: Secondary | ICD-10-CM | POA: Diagnosis not present

## 2018-11-17 DIAGNOSIS — R9431 Abnormal electrocardiogram [ECG] [EKG]: Secondary | ICD-10-CM | POA: Diagnosis not present

## 2018-11-17 DIAGNOSIS — Z833 Family history of diabetes mellitus: Secondary | ICD-10-CM | POA: Insufficient documentation

## 2018-11-17 DIAGNOSIS — Z79899 Other long term (current) drug therapy: Secondary | ICD-10-CM | POA: Diagnosis not present

## 2018-11-17 DIAGNOSIS — Z809 Family history of malignant neoplasm, unspecified: Secondary | ICD-10-CM | POA: Diagnosis not present

## 2018-11-17 DIAGNOSIS — I4819 Other persistent atrial fibrillation: Secondary | ICD-10-CM | POA: Diagnosis not present

## 2018-11-17 DIAGNOSIS — I4892 Unspecified atrial flutter: Secondary | ICD-10-CM | POA: Diagnosis not present

## 2018-11-17 DIAGNOSIS — I129 Hypertensive chronic kidney disease with stage 1 through stage 4 chronic kidney disease, or unspecified chronic kidney disease: Secondary | ICD-10-CM | POA: Diagnosis not present

## 2018-11-17 MED ORDER — FUROSEMIDE 20 MG PO TABS: 20.0000 mg | ORAL_TABLET | Freq: Every day | ORAL | 3 refills | Status: DC

## 2018-11-17 MED ORDER — METOPROLOL SUCCINATE ER 25 MG PO TB24: 37.5000 mg | ORAL_TABLET | Freq: Two times a day (BID) | ORAL | 1 refills | Status: DC

## 2018-11-17 MED ORDER — DILTIAZEM HCL 30 MG PO TABS: ORAL_TABLET | ORAL | 3 refills | Status: DC

## 2018-11-17 NOTE — Patient Instructions (Signed)
Increase metoprolol to 37.5mg  once a day (1 and 1/2 tablets of your 25mg  tab)  Start Lasix 20mg  once a day

## 2018-11-17 NOTE — Progress Notes (Signed)
Patient ID: Hannah [name], female   DOB: November 23, 1940, 78 y.o.   MRN: [id number]     Primary Care Physician: Darcus Austin, MD (Inactive) Referring Physician: Dr. Drucie Ip [name] is a 78 y.o. female with a h/o afib/flutter for f/u. She presents in afib, which I feel that she is living in persistent afib for some time.  I have not seen her  for over some time for pt cancelling appointments. She states that she is now in a retirement community and has been quarantined to her apartment. She is smoking, eating and drinking more wine. She is c/o of more LLE and wanted to be seen. Her weight is up around 8-9 pounds since I saw her last.   Today, she denies symptoms of palpitations, chest pain, shortness of breath, orthopnea, PND, lower extremity edema, dizziness, presyncope, syncope, or neurologic sequela. The patient is tolerating medications without difficulties and is otherwise without complaint today.   Past Medical History:  Diagnosis Date  . A-fib ([place])   . CKD (chronic kidney disease) stage 3, GFR 30-59 ml/min (HCC)   . Colon polyps   . Diverticulosis   . DJD (degenerative joint disease)   . HLD (hyperlipidemia)    no medication  . Hx of cardiovascular stress test    Myoview 3/15: No ischemia EF 58%, low risk  . Hx of echocardiogram    Echo 4/16: EF 55-60%, no RWMA, mild BAE, atrial lipomatous hypertrophy, trivial MR, trivial TR  . Hypertension   . Intracranial hemorrhage ([place])   . Microscopic hematuria   . OSA (obstructive sleep apnea)    uses CPAP occasionally  . Spinal stenosis    chronic pain  . Subdural hematoma ([place])    1987 - treated at [facility] in [place]   Past Surgical History:  Procedure Laterality Date  . CARDIOVERSION N/A 01/18/2015   Procedure: CARDIOVERSION;  Surgeon: Pixie Casino, MD;  Location: [facility] ENDOSCOPY;  Service: Cardiovascular;  Laterality: N/A;  . CHOLECYSTECTOMY    . TEE WITHOUT CARDIOVERSION N/A 01/18/2015   Procedure: TRANSESOPHAGEAL  ECHOCARDIOGRAM (TEE);  Surgeon: Pixie Casino, MD;  Location: [facility] ENDOSCOPY;  Service: Cardiovascular;  Laterality: N/A;    Current Outpatient Medications  Medication Sig Dispense Refill  . ALPRAZolam (XANAX) 0.25 MG tablet Take 1 tablet by mouth At bedtime as needed for sleep.     Marland Kitchen apixaban (ELIQUIS) 5 MG TABS tablet Take 1 tablet (5 mg total) by mouth 2 (two) times daily. 180 tablet 0  . CARTIA XT 180 MG 24 hr capsule Take 1 capsule (180 mg total) by mouth daily. 90 capsule 1  . diltiazem (CARDIZEM) 30 MG tablet Take 1 tablet every 4 hours AS NEEDED for heart rate >110 45 tablet 3  . metoprolol succinate (TOPROL-XL) 25 MG 24 hr tablet Take 1.5 tablets (37.5 mg total) by mouth 2 (two) times daily. 180 tablet 1  . furosemide (LASIX) 20 MG tablet Take 1 tablet (20 mg total) by mouth daily. 30 tablet 3   No current facility-administered medications for this encounter.     No Known Allergies  Social History   Socioeconomic History  . Marital status: Divorced    Spouse name: Not on file  . Number of children: Not on file  . Years of education: Not on file  . Highest education level: Not on file  Occupational History  . Not on file  Social Needs  . Financial resource strain: Not on file  . Food insecurity  Worry: Not on file    Inability: Not on file  . Transportation needs    Medical: Not on file    Non-medical: Not on file  Tobacco Use  . Smoking status: Current Every Day Smoker    Types: Cigarettes  . Smokeless tobacco: Never Used  . Tobacco comment: 4 cigarettes per day  Substance and Sexual Activity  . Alcohol use: Yes    Alcohol/week: 1.0 standard drinks    Types: 1 Standard drinks or equivalent per week    Comment: twice a week  . Drug use: No  . Sexual activity: Not on file  Lifestyle  . Physical activity    Days per week: Not on file    Minutes per session: Not on file  . Stress: Not on file  Relationships  . Social Musician on phone: Not on  file    Gets together: Not on file    Attends religious service: Not on file    Active member of club or organization: Not on file    Attends meetings of clubs or organizations: Not on file    Relationship status: Not on file  . Intimate partner violence    Fear of current or ex partner: Not on file    Emotionally abused: Not on file    Physically abused: Not on file    Forced sexual activity: Not on file  Other Topics Concern  . Not on file  Social History Narrative  . Not on file    Family History  Problem Relation Age of Onset  . Cancer Mother   . Heart attack Mother   . Heart disease Mother   . Hypertension Sister   . Heart attack Father   . Suicidality Father   . Other Sister        subdural hematoma - brother and sister died  . Stroke Sister   . Diabetes Sister     ROS- All systems are reviewed and negative except as per the HPI above  Physical Exam: Vitals:   11/17/18 1533  BP: 130/82  Pulse: 100  Weight: 113.9 kg  Height: 5\' 1"  (1.549 m)    GEN- The patient is well appearing, alert and oriented x 3 today.   Head- normocephalic, atraumatic Eyes-  Sclera clear, conjunctiva pink Ears- hearing intact Oropharynx- clear Neck- supple, no JVP Lymph- no cervical lymphadenopathy Lungs- Clear to ausculation bilaterally, normal work of breathing Heart- irregular rate and rhythm, no murmurs, rubs or gallops, PMI not laterally displaced GI- soft, NT, ND, + BS Extremities- no clubbing, cyanosis, or edema MS- no significant deformity or atrophy Skin- no rash or lesion Psych- euthymic mood, full affect Neuro- strength and sensation are intact  EKG- afib at 100 bpm  Epic records reviewed   Assessment and Plan: 1. Afib/flutter  Appears to be in persistent afib  Increase Toprol to 25 mg, 1 1/2 tabs bid for better rate control Continue eliquis 5 mg bid, chadsvasc score of at least 4 Cut back on alcohol and tobacco  2. LLE  Will start lasix 20 mg daily  Avoid  salt  Elevate legs   3. HTN Stable   4. Sleep apnea Untreated States cannot tolerate cpap   F/u in one week   C. Lupita Leash [facility] [address] [place], [place] [phone]

## 2018-11-24 ENCOUNTER — Inpatient Hospital Stay (HOSPITAL_COMMUNITY): Admission: RE | Admit: 2018-11-24 | Payer: Non-veteran care | Source: Ambulatory Visit | Admitting: Physician Assistant

## 2018-12-01 ENCOUNTER — Encounter (HOSPITAL_COMMUNITY): Payer: Self-pay | Admitting: Nurse Practitioner

## 2018-12-01 ENCOUNTER — Ambulatory Visit (HOSPITAL_COMMUNITY)
Admission: RE | Admit: 2018-12-01 | Discharge: 2018-12-01 | Disposition: A | Discharge status: Home / Self Care | Payer: Medicare Other | Source: Ambulatory Visit | Attending: Nurse Practitioner | Admitting: Nurse Practitioner

## 2018-12-01 ENCOUNTER — Other Ambulatory Visit: Payer: Self-pay

## 2018-12-01 VITALS — BP 138/76 | HR 88 | Ht 61.0 in | Wt 228.6 lb

## 2018-12-01 DIAGNOSIS — R600 Localized edema: Secondary | ICD-10-CM | POA: Diagnosis not present

## 2018-12-01 DIAGNOSIS — E785 Hyperlipidemia, unspecified: Secondary | ICD-10-CM | POA: Insufficient documentation

## 2018-12-01 DIAGNOSIS — Z8249 Family history of ischemic heart disease and other diseases of the circulatory system: Secondary | ICD-10-CM | POA: Insufficient documentation

## 2018-12-01 DIAGNOSIS — I482 Chronic atrial fibrillation, unspecified: Secondary | ICD-10-CM | POA: Diagnosis not present

## 2018-12-01 DIAGNOSIS — Z79899 Other long term (current) drug therapy: Secondary | ICD-10-CM | POA: Insufficient documentation

## 2018-12-01 DIAGNOSIS — N183 Chronic kidney disease, stage 3 unspecified: Secondary | ICD-10-CM | POA: Insufficient documentation

## 2018-12-01 DIAGNOSIS — F17210 Nicotine dependence, cigarettes, uncomplicated: Secondary | ICD-10-CM | POA: Insufficient documentation

## 2018-12-01 DIAGNOSIS — I129 Hypertensive chronic kidney disease with stage 1 through stage 4 chronic kidney disease, or unspecified chronic kidney disease: Secondary | ICD-10-CM | POA: Insufficient documentation

## 2018-12-01 DIAGNOSIS — Z9889 Other specified postprocedural states: Secondary | ICD-10-CM | POA: Insufficient documentation

## 2018-12-01 DIAGNOSIS — I4819 Other persistent atrial fibrillation: Secondary | ICD-10-CM | POA: Diagnosis present

## 2018-12-01 DIAGNOSIS — Z7901 Long term (current) use of anticoagulants: Secondary | ICD-10-CM | POA: Insufficient documentation

## 2018-12-01 DIAGNOSIS — G4733 Obstructive sleep apnea (adult) (pediatric): Secondary | ICD-10-CM | POA: Diagnosis not present

## 2018-12-01 DIAGNOSIS — I4892 Unspecified atrial flutter: Secondary | ICD-10-CM | POA: Insufficient documentation

## 2018-12-01 LAB — BASIC METABOLIC PANEL
Anion gap: 10 (ref 5–15)
BUN: 19 mg/dL (ref 8–23)
CO2: 21 mmol/L — ABNORMAL LOW (ref 22–32)
Calcium: 9 mg/dL (ref 8.9–10.3)
Chloride: 108 mmol/L (ref 98–111)
Creatinine, Ser: 1.02 mg/dL — ABNORMAL HIGH (ref 0.44–1.00)
GFR calc Af Amer: >60 mL/min (ref >60)
GFR calc non Af Amer: 53 mL/min — ABNORMAL LOW (ref >60)
Glucose, Bld: 102 mg/dL — ABNORMAL HIGH (ref 70–99)
Potassium: 4.1 mmol/L (ref 3.5–5.1)
Sodium: 139 mmol/L (ref 135–145)

## 2018-12-01 MED ORDER — FUROSEMIDE 20 MG PO TABS: 20.0000 mg | ORAL_TABLET | ORAL | 3 refills | Status: DC

## 2018-12-01 NOTE — Progress Notes (Signed)
Patient ID: Hannah [name], female   DOB: October 13, 1940, 78 y.o.   MRN: [id number]     Primary Care Physician: Hannah Austin, MD (Inactive) Referring Physician: Dr. Drucie Ip [name] is a 78 y.o. female with a h/o afib/flutter for f/u. She presents in afib, which I feel that she is living in persistent afib for some time.  I have not seen her  for over some time for pt cancelling appointments. She states that she is now in a retirement community and has been quarantined to her apartment. She is smoking, eating and drinking more wine. She is c/o of more LLE and wanted to be seen. Her [name] is up around 8-9 pounds since I saw her last.   F/u in the afib clinic. She tells me today that she by mistake has not taken her cartia for a while. [name] may have explained the RVR. Today with increase of BB, she is rate controlled. She has lost 20 lbs by addition of lasix. Still has some LLE .   Today, she denies symptoms of palpitations, chest pain, shortness of breath, orthopnea, PND, lower extremity edema, dizziness, presyncope, syncope, or neurologic sequela. The patient is tolerating medications without difficulties and is otherwise without complaint today.   Past Medical History:  Diagnosis Date  . A-fib ([place])   . CKD (chronic kidney disease) stage 3, GFR 30-59 ml/min (HCC)   . Colon polyps   . Diverticulosis   . DJD (degenerative joint disease)   . HLD (hyperlipidemia)    no medication  . Hx of cardiovascular stress test    Myoview 3/15: No ischemia EF 58%, low risk  . Hx of echocardiogram    Echo 4/16: EF 55-60%, no RWMA, mild BAE, atrial lipomatous hypertrophy, trivial MR, trivial TR  . Hypertension   . Intracranial hemorrhage ([place])   . Microscopic hematuria   . OSA (obstructive sleep apnea)    uses CPAP occasionally  . Spinal stenosis    chronic pain  . Subdural hematoma ([place])    1987 - [facility] at [name] in [place]   Past Surgical History:  Procedure Laterality Date  . CARDIOVERSION  N/A 01/18/2015   Procedure: CARDIOVERSION;  Surgeon: Hannah Casino, MD;  Location: [facility] ENDOSCOPY;  Service: Cardiovascular;  Laterality: N/A;  . CHOLECYSTECTOMY    . TEE WITHOUT CARDIOVERSION N/A 01/18/2015   Procedure: TRANSESOPHAGEAL ECHOCARDIOGRAM (TEE);  Surgeon: Hannah Casino, MD;  Location: [name] ENDOSCOPY;  Service: Cardiovascular;  Laterality: N/A;    Current Outpatient Medications  Medication Sig Dispense Refill  . ALPRAZolam (XANAX) 0.25 MG tablet Take 1 tablet by mouth as needed for sleep.     Marland Kitchen apixaban (ELIQUIS) 5 MG TABS tablet Take 1 tablet (5 mg total) by mouth 2 (two) times daily. 180 tablet 0  . diltiazem (CARDIZEM) 30 MG tablet Take 1 tablet every 4 hours AS NEEDED for heart rate >110 45 tablet 3  . furosemide (LASIX) 20 MG tablet Take 1 tablet (20 mg total) by mouth daily. 30 tablet 3  . metoprolol succinate (TOPROL-XL) 25 MG 24 hr tablet Take 1.5 tablets (37.5 mg total) by mouth 2 (two) times daily. 180 tablet 1   No current facility-administered medications for this encounter.     No Known Allergies  Social History   Socioeconomic History  . Marital status: Divorced    Spouse name: Not on file  . Number of children: Not on file  . Years of education: Not on file  . Highest  education level: Not on file  Occupational History  . Not on file  Social Needs  . Financial resource strain: Not on file  . Food insecurity    Worry: Not on file    Inability: Not on file  . Transportation needs    Medical: Not on file    Non-medical: Not on file  Tobacco Use  . Smoking status: Current Every Day Smoker    Types: Cigarettes  . Smokeless tobacco: Never Used  . Tobacco comment: 4 cigarettes per day  Substance and Sexual Activity  . Alcohol use: Yes    Alcohol/week: 1.0 standard drinks    Types: 1 Standard drinks or equivalent per week    Comment: twice a week  . Drug use: No  . Sexual activity: Not on file  Lifestyle  . Physical activity    Days per week: Not  on file    Minutes per session: Not on file  . Stress: Not on file  Relationships  . Social Musicianconnections    Talks on phone: Not on file    Gets together: Not on file    Attends religious service: Not on file    Active member of club or organization: Not on file    Attends meetings of clubs or organizations: Not on file    Relationship status: Not on file  . Intimate partner violence    Fear of current or ex partner: Not on file    Emotionally abused: Not on file    Physically abused: Not on file    Forced sexual activity: Not on file  Other Topics Concern  . Not on file  Social History Narrative  . Not on file    Family History  Problem Relation Age of Onset  . Cancer Mother   . Heart attack Mother   . Heart disease Mother   . Hypertension Sister   . Heart attack Father   . Suicidality Father   . Other Sister        subdural hematoma - brother and sister died  . Stroke Sister   . Diabetes Sister     ROS- All systems are reviewed and negative except as per the HPI above  Physical Exam: Vitals:   12/01/18 0853  BP: 138/76  Pulse: 88  [name]: 103.7 kg  Height: 5\' 1"  (1.549 m)    GEN- The patient is well appearing, alert and oriented x 3 today.   Head- normocephalic, atraumatic Eyes-  Sclera clear, conjunctiva pink Ears- hearing intact Oropharynx- clear Neck- supple, no JVP Lymph- no cervical lymphadenopathy Lungs- Clear to ausculation bilaterally, normal work of breathing Heart- irregular rate and rhythm, no murmurs, rubs or gallops, PMI not laterally displaced GI- soft, NT, ND, + BS Extremities- no clubbing, cyanosis, or edema MS- no significant deformity or atrophy Skin- no rash or lesion Psych- euthymic mood, full affect Neuro- strength and sensation are intact  EKG- afib at 88 bpm  Epic records reviewed   Assessment and Plan: 1. Afib/flutter  Appears to be in chronic afib  Continue Toprol to 25 mg, 1 1/2 tabs bid for better rate control She now  tells me that she has not taken cartia for months For now with increase in BB, she appears to be rate controlled Will monitor at home to make sure majority of HR's ar less than 100 bpm If so can leave off cartia for now  Continue eliquis 5 mg bid, chadsvasc score of at least 4 Cut back  on alcohol and tobacco  2. LLE  Will start lasix 20 mg daily  Avoid salt  Elevate legs  [name] is down 20 lbs Take lasix 20 mg qod bmet today   3. HTN Stable   4. Sleep apnea Untreated States cannot tolerate cpap   F/u in 2 months  Lupita Leash C. [facility] [address] [place], [place] [address] [phone]

## 2019-01-29 ENCOUNTER — Ambulatory Visit (HOSPITAL_COMMUNITY): Payer: Non-veteran care | Admitting: Physician Assistant

## 2019-01-29 ENCOUNTER — Ambulatory Visit (HOSPITAL_COMMUNITY): Payer: Non-veteran care | Admitting: Nurse Practitioner

## 2019-03-12 ENCOUNTER — Other Ambulatory Visit (HOSPITAL_COMMUNITY): Payer: Self-pay | Admitting: Nurse Practitioner

## 2019-03-16 ENCOUNTER — Other Ambulatory Visit (HOSPITAL_COMMUNITY): Payer: Self-pay | Admitting: Nurse Practitioner

## 2019-03-30 ENCOUNTER — Other Ambulatory Visit (HOSPITAL_COMMUNITY): Payer: Self-pay | Admitting: *Deleted

## 2019-03-30 MED ORDER — METOPROLOL SUCCINATE ER 25 MG PO TB24: 37.5000 mg | ORAL_TABLET | Freq: Two times a day (BID) | ORAL | 1 refills | Status: DC

## 2019-04-08 ENCOUNTER — Ambulatory Visit (HOSPITAL_COMMUNITY)
Admission: RE | Admit: 2019-04-08 | Discharge: 2019-04-08 | Disposition: A | Discharge status: Home / Self Care | Payer: Non-veteran care | Source: Ambulatory Visit | Attending: Nurse Practitioner | Admitting: Nurse Practitioner

## 2019-04-08 ENCOUNTER — Other Ambulatory Visit: Payer: Self-pay

## 2019-04-08 DIAGNOSIS — Z7901 Long term (current) use of anticoagulants: Secondary | ICD-10-CM

## 2019-04-08 DIAGNOSIS — I4819 Other persistent atrial fibrillation: Secondary | ICD-10-CM | POA: Diagnosis not present

## 2019-04-08 DIAGNOSIS — I480 Paroxysmal atrial fibrillation: Secondary | ICD-10-CM

## 2019-04-08 NOTE — Progress Notes (Signed)
Electrophysiology TeleHealth Note   Due to national recommendations of social distancing due to COVID 19, Audio  telehealth visit is felt to be most appropriate for this patient at this time.  See MyChart message/consent below  from today for patient consent regarding telehealth for the At[facility]illation Clinic.    Date:  04/08/2019   ID:  Hannah [name], DOB 1941-01-16, MRN [id number]  Location: home  Provider location: [address] [place], [place] [address] Evaluation Performed:  Follow up   PCP:  Shaune Pollack, MD (Inactive)  Primary Cardiologist:  none Primary Electrophysiologist:  Afib clinic  CC: persistent  afib   History of Present Illness: Hannah [name] is a 79 y.o. female who presents via audio  conferencing for a telehealth visit today.   The patient is for f/u of persistent afib. SHe is in a nursing facility. She has no ways of checking her HR/BP because she has no batteries for her BP machine. Otherwise, she is feeling ok. Not aware of her heart rate or rhythm No issues with anticoagulation.Contiues on eliquis for a CHA2DS2VASc score of 4.    Today, she denies symptoms of palpitations, chest pain, shortness of breath, orthopnea, PND, lower extremity edema, claudication, dizziness, presyncope, syncope, bleeding, or neurologic sequela. The patient is tolerating medications without difficulties and is otherwise without complaint today.   she denies symptoms of cough, fevers, chills, or new SOB worrisome for COVID 19. She has received the first covid shot.    she has a BMI of There is no height or weight on file to calculate BMI.. There were no vitals filed for this visit.  Past Medical History:  Diagnosis Date  . A-fib (HCC)   . CKD (chronic kidney disease) stage 3, GFR 30-59 ml/min   . Colon polyps   . Diverticulosis   . DJD (degenerative joint disease)   . HLD (hyperlipidemia)    no medication  . Hx of cardiovascular stress test    Myoview 3/15: No ischemia  EF 58%, low risk  . Hx of echocardiogram    Echo 4/16: EF 55-60%, no RWMA, mild BAE, atrial lipomatous hypertrophy, trivial MR, trivial TR  . Hypertension   . Intracranial hemorrhage (HCC)   . Microscopic hematuria   . OSA (obstructive sleep apnea)    uses CPAP occasionally  . Spinal stenosis    chronic pain  . Subdural hematoma (HCC)    1987 - treated at [facility] in [place]   Past Surgical History:  Procedure Laterality Date  . CARDIOVERSION N/A 01/18/2015   Procedure: CARDIOVERSION;  Surgeon: Chrystie Nose, MD;  Location: [facility] ENDOSCOPY;  Service: Cardiovascular;  Laterality: N/A;  . CHOLECYSTECTOMY    . TEE WITHOUT CARDIOVERSION N/A 01/18/2015   Procedure: TRANSESOPHAGEAL ECHOCARDIOGRAM (TEE);  Surgeon: Chrystie Nose, MD;  Location: [facility] ENDOSCOPY;  Service: Cardiovascular;  Laterality: N/A;     Current Outpatient Medications  Medication Sig Dispense Refill  . ALPRAZolam (XANAX) 0.25 MG tablet Take 1 tablet by mouth as needed for sleep.     Marland Kitchen apixaban (ELIQUIS) 5 MG TABS tablet Take 1 tablet (5 mg total) by mouth 2 (two) times daily. 180 tablet 0  . diltiazem (CARDIZEM) 30 MG tablet TAKE 1 TABLET BY MOUTH EVERY 4 HOURS AS NEEDED FOR HEART RATE OVER 110, APPOINTMENT REQUIRED FOR FURTHER REFILLS 45 tablet 1  . furosemide (LASIX) 20 MG tablet TAKE 1 TABLET BY MOUTH EVERY DAY 90 tablet 0  . metoprolol succinate (TOPROL-XL) 25 MG  24 hr tablet Take 1.5 tablets (37.5 mg total) by mouth 2 (two) times daily. 180 tablet 1   No current facility-administered medications for this encounter.    Allergies:   Patient has no known allergies.   Social History:  The patient  reports that she has been smoking cigarettes. She has never used smokeless tobacco. She reports current alcohol use of about 1.0 standard drinks of alcohol per week. She reports that she does not use drugs.   Family History:  The patient's  family history includes Cancer in her mother; Diabetes in her sister; Heart attack in  her father and mother; Heart disease in her mother; Hypertension in her sister; Other in her sister; Stroke in her sister; Suicidality in her father.    ROS:  Please see the history of present illness.   All other systems are personally reviewed and negative.   Exam:  alert and conversant    Recent Labs: 12/01/2018: BUN 19; Creatinine, Ser 1.02; Potassium 4.1; Sodium 139  personally reviewed    Other studies personally reviewed: Epic reports     ASSESSMENT AND PLAN:  1.persisent   atrial fibrillation Pt has no current means of tracking HR or BP asa she is out of batteries foe BP cuff.  Really would need to see pt in office to evaluate HR/BP issues  Metoprolol 37.5 mg daily Has 30 diltiazem   if needed Continue  eliquis 5 mg bid   This patients CHA2DS2-VASc Score and unadjusted Ischemic Stroke Rate (% per year) is equal to 4.8 % stroke rate/year from a score of 4  Above score calculated as 1 point each if present [CHF, HTN, DM, Vascular=MI/PAD/Aortic Plaque, Age if 65-74, or Female] Above score calculated as 2 points each if present [Age > 75, or Stroke/TIA/TE]   COVID screen The patient does not have any symptoms that suggest any further testing/ screening at this time.  Social distancing reinforced today.  She has received first covid shot.   Follow-up: I would really like to see pt in office for further evaluation, she will try to arrange transportation  Current medicines are reviewed at length with the patient today.   The patient does not have concerns regarding her medicines.  The following changes were made today:  none  Labs/ tests ordered today include: none No orders of the defined types were placed in this encounter.   Patient Risk:  after full review of this patients clinical status, I feel that they are at high risk at this time.   Today, I have spent 5 minutes with the patient with telehealth technology discussing above.   Signed, Rudi Coco NP    04/08/2019 9:37 AM  Afib Clinic [facility] [address] [place], [place] [address] [phone]   I hereby voluntarily request, consent and authorize the Atrial Fibrillation Clinic and its employed or contracted physicians, physician assistants, nurse practitioners or other licensed health care professionals (the Practitioner), to provide me with telemedicine health care services (the "Services") as deemed necessary by the treating Practitioner. I acknowledge and consent to receive the Services by the Practitioner via telemedicine. I understand that the telemedicine visit will involve communicating with the Practitioner through live audiovisual communication technology and the disclosure of certain medical information by electronic transmission. I acknowledge that I have been given the opportunity to request an in-person assessment or other available alternative prior to the telemedicine visit and am voluntarily participating in the telemedicine visit.   I understand that  I have the right to withhold or withdraw my consent to the use of telemedicine in the course of my care at any time, without affecting my right to future care or treatment, and that the Practitioner or I may terminate the telemedicine visit at any time. I understand that I have the right to inspect all information obtained and/or recorded in the course of the telemedicine visit and may receive copies of available information for a reasonable fee.  I understand that some of the potential risks of receiving the Services via telemedicine include:   Delay or interruption in medical evaluation due to technological equipment failure or disruption;  Information transmitted may not be sufficient (e.g. poor resolution of images) to allow for appropriate medical decision making by the Practitioner; and/or  In rare instances, security protocols could fail, causing a breach of personal health information.   Furthermore, I  acknowledge that it is my responsibility to provide information about my medical history, conditions and care that is complete and accurate to the best of my ability. I acknowledge that Practitioner's advice, recommendations, and/or decision may be based on factors not within their control, such as incomplete or inaccurate data provided by me or distortions of diagnostic images or specimens that may result from electronic transmissions. I understand that the practice of medicine is not an exact science and that Practitioner makes no warranties or guarantees regarding treatment outcomes. I acknowledge that I will receive a copy of this consent concurrently upon execution via email to the email address I last provided but may also request a printed copy by calling the office of the [facility].  I understand that my insurance will be billed for this visit.   I have read or had this consent read to me.  I understand the contents of this consent, which adequately explains the benefits and risks of the Services being provided via telemedicine.  I have been provided ample opportunity to ask questions regarding this consent and the Services and have had my questions answered to my satisfaction.  I give my informed consent for the services to be provided through the use of telemedicine in my medical care  By participating in this telemedicine visit I agree to the above.

## 2019-04-13 ENCOUNTER — Ambulatory Visit (HOSPITAL_COMMUNITY): Payer: Non-veteran care | Admitting: Nurse Practitioner

## 2019-04-30 ENCOUNTER — Other Ambulatory Visit (HOSPITAL_COMMUNITY): Payer: Self-pay | Admitting: Nurse Practitioner

## 2019-05-13 ENCOUNTER — Ambulatory Visit (HOSPITAL_COMMUNITY)
Admission: RE | Admit: 2019-05-13 | Discharge: 2019-05-13 | Disposition: A | Discharge status: Home / Self Care | Payer: Medicare Other | Source: Ambulatory Visit | Attending: Nurse Practitioner | Admitting: Nurse Practitioner

## 2019-05-13 ENCOUNTER — Encounter (HOSPITAL_COMMUNITY): Payer: Self-pay | Admitting: Nurse Practitioner

## 2019-05-13 ENCOUNTER — Other Ambulatory Visit: Payer: Self-pay

## 2019-05-13 VITALS — BP 104/80 | HR 95 | Ht 61.0 in | Wt 248.2 lb

## 2019-05-13 DIAGNOSIS — K5790 Diverticulosis of intestine, part unspecified, without perforation or abscess without bleeding: Secondary | ICD-10-CM | POA: Insufficient documentation

## 2019-05-13 DIAGNOSIS — F17210 Nicotine dependence, cigarettes, uncomplicated: Secondary | ICD-10-CM | POA: Insufficient documentation

## 2019-05-13 DIAGNOSIS — Z7901 Long term (current) use of anticoagulants: Secondary | ICD-10-CM | POA: Insufficient documentation

## 2019-05-13 DIAGNOSIS — G4733 Obstructive sleep apnea (adult) (pediatric): Secondary | ICD-10-CM | POA: Insufficient documentation

## 2019-05-13 DIAGNOSIS — Z79899 Other long term (current) drug therapy: Secondary | ICD-10-CM | POA: Insufficient documentation

## 2019-05-13 DIAGNOSIS — I129 Hypertensive chronic kidney disease with stage 1 through stage 4 chronic kidney disease, or unspecified chronic kidney disease: Secondary | ICD-10-CM | POA: Diagnosis not present

## 2019-05-13 DIAGNOSIS — D6869 Other thrombophilia: Secondary | ICD-10-CM

## 2019-05-13 DIAGNOSIS — E785 Hyperlipidemia, unspecified: Secondary | ICD-10-CM | POA: Insufficient documentation

## 2019-05-13 DIAGNOSIS — R490 Dysphonia: Secondary | ICD-10-CM | POA: Diagnosis not present

## 2019-05-13 DIAGNOSIS — N183 Chronic kidney disease, stage 3 unspecified: Secondary | ICD-10-CM | POA: Insufficient documentation

## 2019-05-13 DIAGNOSIS — I4821 Permanent atrial fibrillation: Secondary | ICD-10-CM | POA: Insufficient documentation

## 2019-05-13 DIAGNOSIS — I4892 Unspecified atrial flutter: Secondary | ICD-10-CM | POA: Diagnosis not present

## 2019-05-13 LAB — CBC
HCT: 47.7 % — ABNORMAL HIGH (ref 36.0–46.0)
Hemoglobin: 16 g/dL — ABNORMAL HIGH (ref 12.0–15.0)
MCH: 31.7 pg (ref 26.0–34.0)
MCHC: 33.5 g/dL (ref 30.0–36.0)
MCV: 94.5 fL (ref 80.0–100.0)
Platelets: 242 10*3/uL (ref 150–400)
RBC: 5.05 MIL/uL (ref 3.87–5.11)
RDW: 14.1 % (ref 11.5–15.5)
WBC: 7 10*3/uL (ref 4.0–10.5)
nRBC: 0 % (ref 0.0–0.2)

## 2019-05-13 LAB — BASIC METABOLIC PANEL
Anion gap: 12 (ref 5–15)
BUN: 19 mg/dL (ref 8–23)
CO2: 24 mmol/L (ref 22–32)
Calcium: 9.6 mg/dL (ref 8.9–10.3)
Chloride: 105 mmol/L (ref 98–111)
Creatinine, Ser: 1.08 mg/dL — ABNORMAL HIGH (ref 0.44–1.00)
GFR calc Af Amer: 58 mL/min — ABNORMAL LOW (ref >60)
GFR calc non Af Amer: 50 mL/min — ABNORMAL LOW (ref >60)
Glucose, Bld: 109 mg/dL — ABNORMAL HIGH (ref 70–99)
Potassium: 4.9 mmol/L (ref 3.5–5.1)
Sodium: 141 mmol/L (ref 135–145)

## 2019-05-13 NOTE — Progress Notes (Signed)
Patient ID: Hannah [name], female   DOB: 11-23-1940, 79 y.o.   MRN: [id number]     Primary Care Physician: Shaune Pollack, MD (Inactive) Referring Physician: Dr. Noemi Chapel [name] is a 79 y.o. female with a h/o afib/flutter for f/u. She presents in afib, which I feel that she is living in persistent afib for some time.  I have not seen her  for over some time for pt cancelling appointments. She states that she is now in a retirement community and has been quarantined to her apartment. She is smoking, eating and drinking more wine. She is c/o of more LLE and wanted to be seen. Her weight is up around 8-9 pounds since I saw her last.   F/u in the afib clinic. She tells me today that she by mistake has not taken her cartia for a while. [name] may have explained the RVR. Today with increase of BB, she is rate controlled. She has lost 20 lbs by addition of lasix. Still has some LLE .   F/u in afib clinic, 05/13/19. After several missed appointments, pt presents today for f/u in afib clinc. She lives in permanent afib. States that she has fallen a few times, has a resolving bruise on her left flank which appears to be resolving. States that she usually steps on something in the floor and gets off balance.. Weight is up around 10 lbs. Has lasix to use for swelling, does not think she Is fluid overloaded. MAybe true weight gain. Son dies several months ago and has not been able to leave her facility for covid-19. Feels she is eating more.  Rarely uses lasix. Continues to smoke. Hoarse today, states some upper chest congestion. Does not feel afib. States that she use to drink alcohol, but denies current use.   Today, she denies symptoms of palpitations, chest pain, shortness of breath, orthopnea, PND, lower extremity edema, dizziness, presyncope, syncope, or neurologic sequela. The patient is tolerating medications without difficulties and is otherwise without complaint today.   Past Medical History:   Diagnosis Date  . A-fib (HCC)   . CKD (chronic kidney disease) stage 3, GFR 30-59 ml/min   . Colon polyps   . Diverticulosis   . DJD (degenerative joint disease)   . HLD (hyperlipidemia)    no medication  . Hx of cardiovascular stress test    Myoview 3/15: No ischemia EF 58%, low risk  . Hx of echocardiogram    Echo 4/16: EF 55-60%, no RWMA, mild BAE, atrial lipomatous hypertrophy, trivial MR, trivial TR  . Hypertension   . Intracranial hemorrhage (HCC)   . Microscopic hematuria   . OSA (obstructive sleep apnea)    uses CPAP occasionally  . Spinal stenosis    chronic pain  . Subdural hematoma (HCC)    1987 - treated at [facility] in [place]   Past Surgical History:  Procedure Laterality Date  . CARDIOVERSION N/A 01/18/2015   Procedure: CARDIOVERSION;  Surgeon: Chrystie Nose, MD;  Location: [facility] ENDOSCOPY;  Service: Cardiovascular;  Laterality: N/A;  . CHOLECYSTECTOMY    . TEE WITHOUT CARDIOVERSION N/A 01/18/2015   Procedure: TRANSESOPHAGEAL ECHOCARDIOGRAM (TEE);  Surgeon: Chrystie Nose, MD;  Location: [facility] ENDOSCOPY;  Service: Cardiovascular;  Laterality: N/A;    Current Outpatient Medications  Medication Sig Dispense Refill  . ALPRAZolam (XANAX) 0.25 MG tablet Take 1 tablet by mouth as needed for sleep.     Marland Kitchen diltiazem (CARDIZEM) 30 MG tablet TAKE 1 TABLET BY MOUTH EVERY  4 HOURS AS NEEDED FOR HEART RATE OVER 110, APPOINTMENT REQUIRED FOR FURTHER REFILLS 45 tablet 1  . diltiazem (TIAZAC) 120 MG 24 hr capsule Take 120 mg by mouth daily.     Marland Kitchen ELIQUIS 5 MG TABS tablet TAKE 1 TABLET BY MOUTH TWICE DAILY 180 tablet 0  . furosemide (LASIX) 20 MG tablet TAKE 1 TABLET BY MOUTH EVERY DAY (Patient taking differently: Take 20 mg by mouth as needed. ) 90 tablet 0  . metoprolol succinate (TOPROL-XL) 25 MG 24 hr tablet Take 1.5 tablets (37.5 mg total) by mouth 2 (two) times daily. 180 tablet 1   No current facility-administered medications for this encounter.    No Known Allergies  Social  History   Socioeconomic History  . Marital status: Divorced    Spouse name: Not on file  . Number of children: Not on file  . Years of education: Not on file  . Highest education level: Not on file  Occupational History  . Not on file  Tobacco Use  . Smoking status: Current Every Day Smoker    Types: Cigarettes  . Smokeless tobacco: Never Used  . Tobacco comment: 4 cigarettes per day  Substance and Sexual Activity  . Alcohol use: Yes    Alcohol/week: 1.0 standard drinks    Types: 1 Standard drinks or equivalent per week    Comment: twice a week  . Drug use: No  . Sexual activity: Not on file  Other Topics Concern  . Not on file  Social History Narrative  . Not on file   Social Determinants of Health   Financial Resource Strain:   . Difficulty of Paying Living Expenses:   Food Insecurity:   . Worried About Programme researcher, broadcasting/film/video in the Last Year:   . Barista in the Last Year:   Transportation Needs:   . Freight forwarder (Medical):   Marland Kitchen Lack of Transportation (Non-Medical):   Physical Activity:   . Days of Exercise per Week:   . Minutes of Exercise per Session:   Stress:   . Feeling of Stress :   Social Connections:   . Frequency of Communication with Friends and Family:   . Frequency of Social Gatherings with Friends and Family:   . Attends Religious Services:   . Active Member of Clubs or Organizations:   . Attends Banker Meetings:   Marland Kitchen Marital Status:   Intimate Partner Violence:   . Fear of Current or Ex-Partner:   . Emotionally Abused:   Marland Kitchen Physically Abused:   . Sexually Abused:     Family History  Problem Relation Age of Onset  . Cancer Mother   . Heart attack Mother   . Heart disease Mother   . Hypertension Sister   . Heart attack Father   . Suicidality Father   . Other Sister        subdural hematoma - brother and sister died  . Stroke Sister   . Diabetes Sister     ROS- All systems are reviewed and negative except as  per the HPI above  Physical Exam: Vitals:   05/13/19 1402  BP: 104/80  Pulse: 95  Weight: 112.6 kg  Height: 5\' 1"  (1.549 m)    GEN- The patient is well appearing, alert and oriented x 3 today.   Head- normocephalic, atraumatic Eyes-  Sclera clear, conjunctiva pink Ears- hearing intact Oropharynx- hoarse today  Neck- supple, no JVP Lymph- no cervical lymphadenopathy Lungs-  Clear to ausculation bilaterally, normal work of breathing Heart- irregular rate and rhythm, no murmurs, rubs or gallops, PMI not laterally displaced GI- soft, NT, ND, + BS, resolving bruise/hematoma left flank Extremities- no clubbing, cyanosis, or edema MS- no significant deformity or atrophy Skin- no rash or lesion Psych- euthymic mood, full affect Neuro- strength and sensation are intact  EKG- afib at 95 bpm  Epic records reviewed   Assessment and Plan: 1. Permanent  Afib/flutter Reasonable rate control  Continue Toprol to 25 mg, 1 1/2 tabs bid for better rate control/cartia 120 mg daily, at one time pt was not taking but states now she is  Continue eliquis 5 mg bid, chadsvasc score of at least 4 Cut back on alcohol and tobacco Cbc/bmet today  2. LLE  Has lasix to use if she swells   3. HTN Stable   4. Sleep apnea Untreated States cannot tolerate cpap  5. Hoarseness  Robitussin or Muccinex without decongestants for congestion  Stop smoking   6. Falls  Reminded to use a cane or walker in the house Keep floors clear of items she could trip on    F/u in 6 months  Butch Penny C. , [facility] [address] [place], [place] [address] [phone]

## 2019-06-05 ENCOUNTER — Other Ambulatory Visit (HOSPITAL_COMMUNITY): Payer: Self-pay | Admitting: *Deleted

## 2019-06-05 MED ORDER — DILTIAZEM HCL ER BEADS 120 MG PO CP24: 120.0000 mg | ORAL_CAPSULE | Freq: Every day | ORAL | 2 refills | Status: AC

## 2019-06-27 ENCOUNTER — Other Ambulatory Visit (HOSPITAL_COMMUNITY): Payer: Self-pay | Admitting: Nurse Practitioner

## 2020-04-22 ENCOUNTER — Other Ambulatory Visit (HOSPITAL_COMMUNITY): Payer: Self-pay | Admitting: *Deleted

## 2020-04-22 MED ORDER — APIXABAN 5 MG PO TABS: 5.0000 mg | ORAL_TABLET | Freq: Two times a day (BID) | ORAL | 0 refills | Status: AC

## 2021-05-01 ENCOUNTER — Emergency Department (HOSPITAL_BASED_OUTPATIENT_CLINIC_OR_DEPARTMENT_OTHER): Payer: No Typology Code available for payment source

## 2021-05-01 ENCOUNTER — Encounter (HOSPITAL_BASED_OUTPATIENT_CLINIC_OR_DEPARTMENT_OTHER): Payer: Self-pay | Admitting: Emergency Medicine

## 2021-05-01 ENCOUNTER — Other Ambulatory Visit: Payer: Self-pay

## 2021-05-01 ENCOUNTER — Emergency Department (HOSPITAL_BASED_OUTPATIENT_CLINIC_OR_DEPARTMENT_OTHER)
Admission: EM | Admit: 2021-05-01 | Discharge: 2021-05-01 | Disposition: A | Discharge status: Home / Self Care | Payer: No Typology Code available for payment source | Attending: Emergency Medicine | Admitting: Emergency Medicine

## 2021-05-01 DIAGNOSIS — Z20822 Contact with and (suspected) exposure to covid-19: Secondary | ICD-10-CM | POA: Diagnosis not present

## 2021-05-01 DIAGNOSIS — I129 Hypertensive chronic kidney disease with stage 1 through stage 4 chronic kidney disease, or unspecified chronic kidney disease: Secondary | ICD-10-CM | POA: Insufficient documentation

## 2021-05-01 DIAGNOSIS — Z79899 Other long term (current) drug therapy: Secondary | ICD-10-CM | POA: Insufficient documentation

## 2021-05-01 DIAGNOSIS — Z7901 Long term (current) use of anticoagulants: Secondary | ICD-10-CM | POA: Insufficient documentation

## 2021-05-01 DIAGNOSIS — I4891 Unspecified atrial fibrillation: Secondary | ICD-10-CM | POA: Diagnosis not present

## 2021-05-01 DIAGNOSIS — J189 Pneumonia, unspecified organism: Secondary | ICD-10-CM | POA: Diagnosis not present

## 2021-05-01 DIAGNOSIS — N183 Chronic kidney disease, stage 3 unspecified: Secondary | ICD-10-CM | POA: Insufficient documentation

## 2021-05-01 DIAGNOSIS — R0602 Shortness of breath: Secondary | ICD-10-CM | POA: Diagnosis present

## 2021-05-01 LAB — BASIC METABOLIC PANEL
Anion gap: 5 (ref 5–15)
BUN: 18 mg/dL (ref 8–23)
CO2: 32 mmol/L (ref 22–32)
Calcium: 9.7 mg/dL (ref 8.9–10.3)
Chloride: 98 mmol/L (ref 98–111)
Creatinine, Ser: 0.87 mg/dL (ref 0.44–1.00)
GFR, Estimated: >60 mL/min (ref >60)
Glucose, Bld: 109 mg/dL — ABNORMAL HIGH (ref 70–99)
Potassium: 4.8 mmol/L (ref 3.5–5.1)
Sodium: 135 mmol/L (ref 135–145)

## 2021-05-01 LAB — CBC WITH DIFFERENTIAL/PLATELET
Abs Immature Granulocytes: 0.02 10*3/uL (ref 0.00–0.07)
Basophils Absolute: 0 10*3/uL (ref 0.0–0.1)
Basophils Relative: 1 %
Eosinophils Absolute: 0 10*3/uL (ref 0.0–0.5)
Eosinophils Relative: 0 %
HCT: 43.9 % (ref 36.0–46.0)
Hemoglobin: 14.3 g/dL (ref 12.0–15.0)
Immature Granulocytes: 0 %
Lymphocytes Relative: 19 %
Lymphs Abs: 1.2 10*3/uL (ref 0.7–4.0)
MCH: 31.6 pg (ref 26.0–34.0)
MCHC: 32.6 g/dL (ref 30.0–36.0)
MCV: 97.1 fL (ref 80.0–100.0)
Monocytes Absolute: 0.6 10*3/uL (ref 0.1–1.0)
Monocytes Relative: 10 %
Neutro Abs: 4.4 10*3/uL (ref 1.7–7.7)
Neutrophils Relative %: 70 %
Platelets: 198 10*3/uL (ref 150–400)
RBC: 4.52 MIL/uL (ref 3.87–5.11)
RDW: 13.3 % (ref 11.5–15.5)
WBC: 6.3 10*3/uL (ref 4.0–10.5)
nRBC: 0 % (ref 0.0–0.2)

## 2021-05-01 LAB — BRAIN NATRIURETIC PEPTIDE: B Natriuretic Peptide: 135.5 pg/mL — ABNORMAL HIGH (ref 0.0–100.0)

## 2021-05-01 LAB — RESP PANEL BY RT-PCR (FLU A&B, COVID) ARPGX2
Influenza A by PCR: NEGATIVE
Influenza B by PCR: NEGATIVE
SARS Coronavirus 2 by RT PCR: NEGATIVE

## 2021-05-01 MED ORDER — ALBUTEROL SULFATE HFA 108 (90 BASE) MCG/ACT IN AERS: 1.0000 | INHALATION_SPRAY | Freq: Four times a day (QID) | RESPIRATORY_TRACT | 0 refills | Status: AC | PRN

## 2021-05-01 MED ORDER — IPRATROPIUM-ALBUTEROL 0.5-2.5 (3) MG/3ML IN SOLN: 3.0000 mL | Freq: Once | RESPIRATORY_TRACT | Status: AC

## 2021-05-01 MED ORDER — SODIUM CHLORIDE 0.9 % IV SOLN
1.0000 g | Freq: Once | INTRAVENOUS | Status: AC
  Administered 2021-05-01: 1 g via INTRAVENOUS
  Filled 2021-05-01: qty 10

## 2021-05-01 MED ORDER — ALBUTEROL SULFATE HFA 108 (90 BASE) MCG/ACT IN AERS: 2.0000 | INHALATION_SPRAY | RESPIRATORY_TRACT | Status: DC | PRN

## 2021-05-01 MED ORDER — IPRATROPIUM-ALBUTEROL 0.5-2.5 (3) MG/3ML IN SOLN
RESPIRATORY_TRACT | Status: AC
  Administered 2021-05-01: 3 mL via RESPIRATORY_TRACT
  Filled 2021-05-01: qty 3

## 2021-05-01 MED ORDER — AMOXICILLIN-POT CLAVULANATE 875-125 MG PO TABS: 1.0000 | ORAL_TABLET | Freq: Two times a day (BID) | ORAL | 0 refills | Status: DC

## 2021-05-01 MED ORDER — SODIUM CHLORIDE 0.9 % IV SOLN
500.0000 mg | Freq: Once | INTRAVENOUS | Status: AC
  Administered 2021-05-01: 500 mg via INTRAVENOUS
  Filled 2021-05-01: qty 5

## 2021-05-01 MED ORDER — AEROCHAMBER PLUS FLO-VU MISC
1.0000 | Freq: Once | Status: AC
  Administered 2021-05-01: 1
  Filled 2021-05-01: qty 1

## 2021-05-01 MED ORDER — AZITHROMYCIN 250 MG PO TABS: 250.0000 mg | ORAL_TABLET | Freq: Every day | ORAL | 0 refills | Status: DC

## 2021-05-01 MED ORDER — ALBUTEROL SULFATE HFA 108 (90 BASE) MCG/ACT IN AERS
INHALATION_SPRAY | RESPIRATORY_TRACT | Status: AC
  Administered 2021-05-01: 2 via RESPIRATORY_TRACT
  Filled 2021-05-01: qty 6.7

## 2021-05-01 NOTE — ED Provider Notes (Signed)
[place] EMERGENCY DEPT Provider Note   CSN: [id number] Arrival date & time: 05/01/21  0413     History  Chief Complaint  Patient presents with   Shortness of Breath    Hannah [name] is a 81 y.o. female.  HPI     This is a 81 year old female with a history of atrial fibrillation, chronic kidney disease, hypertension who presents with chills, shortness of breath.  Patient reports 2-day history of worsening shortness of breath.  She describes orthopnea.  She also describes a cough and chills.  No documented fevers at home.  She has had some congestion and took Mucinex with minimal relief.  Denies any history of heart failure.  At baseline she has some left greater than right lower extremity swelling.  This is unchanged from prior.  No known sick contacts although she does live in an assisted living.  She denies chest pain.  Home Medications Prior to Admission medications   Medication Sig Start Date End Date Taking? Authorizing Provider  albuterol (VENTOLIN HFA) 108 (90 Base) MCG/ACT inhaler Inhale 1-2 puffs into the lungs every 6 (six) hours as needed for wheezing or shortness of breath. 05/01/21  Yes Colbie Sliker, Barbette Hair, MD  amoxicillin-clavulanate (AUGMENTIN) 875-125 MG tablet Take 1 tablet by mouth every 12 (twelve) hours. 05/01/21  Yes Khaya Theissen, Barbette Hair, MD  azithromycin (ZITHROMAX) 250 MG tablet Take 1 tablet (250 mg total) by mouth daily. Take first 2 tablets together, then 1 every day until finished. 05/01/21  Yes Mckynzie Liwanag, Barbette Hair, MD  ALPRAZolam Duanne Moron) 0.25 MG tablet Take 1 tablet by mouth as needed for sleep.  01/03/12   [provider]  apixaban (ELIQUIS) 5 MG TABS tablet Take 1 tablet (5 mg total) by mouth 2 (two) times daily. appt needed for refills [phone] 04/22/20   Sherran Needs, NP  diltiazem (CARDIZEM) 30 MG tablet TAKE 1 TABLET BY MOUTH EVERY 4 HOURS AS NEEDED FOR HEART RATE OVER 110, APPOINTMENT REQUIRED FOR FURTHER REFILLS 03/16/19    Sherran Needs, NP  diltiazem [facility]) 120 MG 24 hr capsule Take 1 capsule (120 mg total) by mouth daily. 06/05/19   Sherran Needs, NP  furosemide (LASIX) 20 MG tablet TAKE 1 TABLET BY MOUTH EVERY DAY Patient taking differently: Take 20 mg by mouth as needed.  03/12/19   Sherran Needs, NP  metoprolol succinate (TOPROL-XL) 25 MG 24 hr tablet TAKE 1 AND 1/2 TABLETS(37.5 MG) BY MOUTH TWICE DAILY 06/29/19   Sherran Needs, NP      Allergies    Patient has no known allergies.    Review of Systems   Review of Systems  Constitutional:  Positive for chills.  Respiratory:  Positive for cough and shortness of breath.   Cardiovascular:  Negative for chest pain.  All other systems reviewed and are negative.  Physical Exam Updated Vital Signs BP 112/76 (BP Location: Right Arm)    Pulse 77    Temp 97.6 F (36.4 C) (Oral)    Resp (!) 22    Ht 1.524 m (5')    Wt 104.3 kg    SpO2 93%    BMI 44.92 kg/m  Physical Exam Vitals and nursing note reviewed.  Constitutional:      Appearance: She is well-developed. She is obese. She is not ill-appearing.  HENT:     Head: Normocephalic and atraumatic.  Eyes:     Pupils: Pupils are equal, round, and reactive to light.  Cardiovascular:  Rate and Rhythm: Normal rate and regular rhythm.     Heart sounds: Normal heart sounds.  Pulmonary:     Effort: Pulmonary effort is normal. No respiratory distress.     Breath sounds: Examination of the right-lower field reveals rhonchi. Examination of the left-lower field reveals rhonchi. Wheezing and rhonchi present.     Comments: Fair air movement, no respiratory distress Abdominal:     General: Bowel sounds are normal.     Palpations: Abdomen is soft.  Musculoskeletal:     Cervical back: Neck supple.     Right lower leg: Edema present.     Left lower leg: Edema present.  Skin:    General: Skin is warm and dry.  Neurological:     Mental Status: She is alert and oriented to person, place, and time.   Psychiatric:        Mood and Affect: Mood normal.    ED Results / Procedures / Treatments   Labs (all labs ordered are listed, but only abnormal results are displayed) Labs Reviewed  BASIC METABOLIC PANEL - Abnormal; Notable for the following components:      Result Value   Glucose, Bld 109 (*)    All other components within normal limits  BRAIN NATRIURETIC PEPTIDE - Abnormal; Notable for the following components:   B Natriuretic Peptide 135.5 (*)    All other components within normal limits  RESP PANEL BY RT-PCR (FLU A&B, COVID) [id number]  CULTURE, BLOOD (ROUTINE X 2)  CULTURE, BLOOD (ROUTINE X 2)  CBC WITH DIFFERENTIAL/PLATELET    EKG EKG Interpretation  Date/Time:  Monday May 01 2021 04:45:20 EST Ventricular Rate:  76 PR Interval:  176 QRS Duration: 94 QT Interval:  406 QTC Calculation: 457 R Axis:   88 Text Interpretation: Atrial fibrillation Borderline right axis deviation Low voltage, precordial leads Baseline wander in lead(s) III Confirmed by Thayer Jew [phone]) on 05/01/2021 5:06:20 AM  Radiology DG Chest Portable 1 View  Result Date: 05/01/2021 CLINICAL DATA:  81 year old female with history of shortness of breath. Flu-like symptoms for the past 2 days. Cough. EXAM: PORTABLE CHEST 1 VIEW COMPARISON:  Chest x-ray 05/11/2014. FINDINGS: Lung volumes are low. There are ill-defined opacities throughout the mid to lower lungs bilaterally which may reflect areas of atelectasis and/or consolidation. Trace right and small left pleural effusions. No pneumothorax. No pulmonary edema. Heart size is mildly enlarged. The patient is rotated to the left on today's exam, resulting in distortion of the mediastinal contours and reduced diagnostic sensitivity and specificity for mediastinal pathology. Atherosclerotic calcifications in the thoracic aorta. IMPRESSION: 1. Ill-defined opacities throughout the mid to lower lungs bilaterally suspicious for areas of airspace consolidation,  although some degree of atelectasis is also likely present. Clinical correlation for signs and symptoms of atypical infection such as viral pneumonia is recommended. 2. Trace right and small left pleural effusions. 3. Mild cardiomegaly. 4. Aortic atherosclerosis. Electronically Signed   By: Vinnie Langton M.D.   On: 05/01/2021 05:06    Procedures Procedures    Medications Ordered in ED Medications  azithromycin (ZITHROMAX) 500 mg in sodium chloride 0.9 % 250 mL IVPB (500 mg Intravenous New Bag/Given 05/01/21 0611)  albuterol (VENTOLIN HFA) 108 (90 Base) MCG/ACT inhaler 2 puff (2 puffs Inhalation Given 05/01/21 0605)  ipratropium-albuterol (DUONEB) 0.5-2.5 (3) MG/3ML nebulizer solution 3 mL (3 mLs Nebulization Given 05/01/21 0459)  cefTRIAXone (ROCEPHIN) 1 g in sodium chloride 0.9 % 100 mL IVPB (0 g Intravenous Stopped 05/01/21 0610)  aerochamber  plus with mask device 1 each (1 each Other Given 05/01/21 0605)    ED Course/ Medical Decision Making/ A&P Clinical Course as of 05/01/21 0657  Mon May 01, 2021  0602 Patient ambulated with O2 sats of 88%.  On my recheck, she states she feels much comfortable after a DuoNeb.  She reports that she does not wish to be admitted.  I discussed with her that her shortness of breath is likely related to pneumonia.  Given her smoking history and improvement with nebulizer, highly suspect she may have some element of undiagnosed COPD.  She will be given an inhaler.  We will treat for community-acquired pneumonia. [CH]    Clinical Course User Index [CH] Zayaan Kozak, Barbette Hair, MD                           Medical Decision Making Amount and/or Complexity of Data Reviewed Labs: ordered. Radiology: ordered.  Risk Prescription drug management.   This patient presents to the ED for concern of shortness of breath, this involves an extensive number of treatment options, and is a complaint that carries with it a high risk of complications and morbidity.  The differential  diagnosis includes pneumonia, COPD, diagnosed CHF, not consistent with ACS  MDM:    This is a 81 year old female who presents with shortness of breath.  Over the last several days.  Also reports chills and upper respiratory symptoms.  She has some wheezing on exam.  O2 sats in the mid to low 90s.  She is in no obvious respiratory distress.  Chest x-ray obtained and is concerning for bilateral versus atypical pneumonia.  COVID and influenza testing are negative.  She has no leukocytosis but given x-ray findings, would favor to treat for community-acquired pneumonia.  Patient was given Rocephin and azithromycin.  Her other lab work-up is reassuring.  She had significant improvement with bronchodilators.  She has a history of smoking but no documented diagnosis of COPD.  She has borderline O2 sats with ambulation.  She wishes to trial home antibiotics.  Feel this is reasonable as she is in no respiratory distress.  We will plan for discharge with Augmentin and azithromycin.  She will also be given a prescription for an inhaler.  On multiple rechecks she is clinically stable. (Labs, imaging)  Labs: I Ordered, and personally interpreted labs.  The pertinent results include: Normal white count, normal metabolic panel  Imaging Studies ordered: I ordered imaging studies including bilateral atypical pneumonia I independently visualized and interpreted imaging. I agree with the radiologist interpretation  Additional history obtained from friend at bedside.  External records from outside source obtained and reviewed including prior hospitalizations  Critical Interventions: IV Rocephin, azithromycin  Consultations: I requested consultation with the none,  and discussed lab and imaging findings as well as pertinent plan - they recommend: None  Cardiac Monitoring: The patient was maintained on a cardiac monitor.  I personally viewed and interpreted the cardiac monitored which showed an underlying rhythm  of: Normal sinus rhythm  Reevaluation: After the interventions noted above, I reevaluated the patient and found that they have :improved   Considered admission for: Monitor hypoxia, pneumonia  Social Determinants of Health: Lives in assisted living  Disposition: Discharged with close follow-up  Co morbidities that complicate the patient evaluation  Past Medical History:  Diagnosis Date   A-fib ([place])    CKD (chronic kidney disease) stage 3, GFR 30-59 ml/min ([place])  Colon polyps    Diverticulosis    DJD (degenerative joint disease)    HLD (hyperlipidemia)    no medication   Hx of cardiovascular stress test    Myoview 3/15: No ischemia EF 58%, low risk   Hx of echocardiogram    Echo 4/16: EF 55-60%, no RWMA, mild BAE, atrial lipomatous hypertrophy, trivial MR, trivial TR   Hypertension    Intracranial hemorrhage (H[facility])    Microscopic hematuria    OSA (obstructive sleep apnea)    uses CPAP occasionally   Spinal stenosis    chronic pain   Subdural hematoma    1987 - treated at [facility] in [place]     Medicines Meds ordered this encounter  Medications   ipratropium-albuterol (DUONEB) 0.5-2.5 (3) MG/3ML nebulizer solution 3 mL   ipratropium-albuterol (DUONEB) 0.5-2.5 (3) MG/3ML nebulizer solution    Dunnavant, Wendy M: cabinet override   cefTRIAXone (ROCEPHIN) 1 g in sodium chloride 0.9 % 100 mL IVPB    Order Specific Question:   Antibiotic Indication:    Answer:   CAP   azithromycin (ZITHROMAX) 500 mg in sodium chloride 0.9 % 250 mL IVPB   albuterol (VENTOLIN HFA) 108 (90 Base) MCG/ACT inhaler 2 puff   aerochamber plus with mask device 1 each   albuterol (VENTOLIN HFA) 108 (90 Base) MCG/ACT inhaler    Dunnavant, Wendy M: cabinet override   amoxicillin-clavulanate (AUGMENTIN) 875-125 MG tablet    Sig: Take 1 tablet by mouth every 12 (twelve) hours.    Dispense:  14 tablet    Refill:  0   azithromycin (ZITHROMAX) 250 MG tablet    Sig: Take 1 tablet (250 mg total) by  mouth daily. Take first 2 tablets together, then 1 every day until finished.    Dispense:  6 tablet    Refill:  0   albuterol (VENTOLIN HFA) 108 (90 Base) MCG/ACT inhaler    Sig: Inhale 1-2 puffs into the lungs every 6 (six) hours as needed for wheezing or shortness of breath.    Dispense:  1 each    Refill:  0    I have reviewed the patients home medicines and have made adjustments as needed  Problem List / ED Course: Problem List Items Addressed This Visit   None Visit Diagnoses     Community acquired pneumonia, unspecified laterality    -  Primary   Relevant Medications   azithromycin (ZITHROMAX) 500 mg in sodium chloride 0.9 % 250 mL IVPB   albuterol (VENTOLIN HFA) 108 (90 Base) MCG/ACT inhaler 2 puff   amoxicillin-clavulanate (AUGMENTIN) 875-125 MG tablet   azithromycin (ZITHROMAX) 250 MG tablet   albuterol (VENTOLIN HFA) 108 (90 Base) MCG/ACT inhaler                   Final Clinical Impression(s) / ED Diagnoses Final diagnoses:  Community acquired pneumonia, unspecified laterality    Rx / DC Orders ED Discharge Orders          Ordered    amoxicillin-clavulanate (AUGMENTIN) 875-125 MG tablet  Every 12 hours        05/01/21 0633    azithromycin (ZITHROMAX) 250 MG tablet  Daily        05/01/21 0633    albuterol (VENTOLIN HFA) 108 (90 Base) MCG/ACT inhaler  Every 6 hours PRN        05/01/21 5093              Shon Baton, MD 05/01/21 [phone]

## 2021-05-01 NOTE — ED Triage Notes (Signed)
Pt reports flu-like symptoms x 2 days, headache, body ache, SOB, cough, decreased appetite   ?

## 2021-05-01 NOTE — ED Notes (Signed)
RT walked pt on pulse ox w/sats of 88% on RA. Pt states her breathing feels like it normally does walking. MD notified. RT will continue to monitor.  ?

## 2021-05-01 NOTE — ED Notes (Signed)
RT educated pt on proper use of MDI w/spacer. Pt able to perform w/out difficulty. Pt verbalizes understanding of administration instruction and dosing.  ?

## 2021-05-01 NOTE — Discharge Instructions (Signed)
You were seen today for shortness of breath.  Your chest x-ray suggests pneumonia.  You may also have an element of wheezing related to your former smoking.  Use your inhaler as needed.  Take antibiotics as prescribed.  Follow-up closely with your primary care physician.  If you have worsening shortness of breath, develop chest pain or any new or worsening symptoms, you should be reevaluated. ?

## 2021-05-06 LAB — CULTURE, BLOOD (ROUTINE X 2)
Culture: NO GROWTH
Culture: NO GROWTH
Special Requests: ADEQUATE

## 2022-03-16 ENCOUNTER — Encounter: Payer: Self-pay | Admitting: Physician Assistant

## 2022-04-04 ENCOUNTER — Ambulatory Visit: Payer: Non-veteran care | Admitting: Physician Assistant

## 2022-04-05 ENCOUNTER — Encounter (HOSPITAL_COMMUNITY): Payer: Self-pay | Admitting: *Deleted

## 2022-05-03 ENCOUNTER — Encounter: Payer: Self-pay | Admitting: Physician Assistant

## 2022-05-03 ENCOUNTER — Ambulatory Visit (INDEPENDENT_AMBULATORY_CARE_PROVIDER_SITE_OTHER): Payer: No Typology Code available for payment source | Admitting: Physician Assistant

## 2022-05-03 ENCOUNTER — Telehealth: Payer: Self-pay

## 2022-05-03 VITALS — BP 130/70 | HR 95 | Ht 61.0 in | Wt 257.0 lb

## 2022-05-03 DIAGNOSIS — I4891 Unspecified atrial fibrillation: Secondary | ICD-10-CM | POA: Diagnosis not present

## 2022-05-03 DIAGNOSIS — Z86010 Personal history of colonic polyps: Secondary | ICD-10-CM

## 2022-05-03 MED ORDER — METOCLOPRAMIDE HCL 10 MG PO TABS: ORAL_TABLET | ORAL | 0 refills | Status: AC

## 2022-05-03 MED ORDER — PLENVU 140 G PO SOLR: 1.0000 | ORAL | 0 refills | Status: AC

## 2022-05-03 NOTE — Telephone Encounter (Signed)
Called [place]: [phone], got nurse navigator kent ammermand: cell- [phone], left message for kent requesting last colon and path and phone # where we can request anticoag clearance

## 2022-05-03 NOTE — Patient Instructions (Addendum)
You have been scheduled for a colonoscopy. Please follow written instructions given to you at your visit today.  Please pick up your prep supplies at the pharmacy within the next 1-3 days. If you use inhalers (even only as needed), please bring them with you on the day of your procedure.  We have given you samples of the following medication to take: Plenvu   We will reach out you regarding holding your Eliquis.  We are requesting records for your colonoscopy in 2022 from the [place] clinic. _______________________________________________________  If your blood pressure at your visit was 140/90 or greater, please contact your primary care physician to follow up on this.  _______________________________________________________  If you are age 70 or older, your body mass index should be between 23-30. Your Body mass index is 48.56 kg/m. If this is out of the aforementioned range listed, please consider follow up with your Primary Care Provider.  If you are age 25 or younger, your body mass index should be between 19-25. Your Body mass index is 48.56 kg/m. If this is out of the aformentioned range listed, please consider follow up with your Primary Care Provider.   ________________________________________________________  The [facility] providers would like to encourage you to use [facility] to communicate with providers for non-urgent requests or questions.  Due to long hold times on the telephone, sending your provider a message by [facility] may be a faster and more efficient way to get a response.  Please allow 48 business hours for a response.  Please remember that this is for non-urgent requests.  _______________________________________________________ It was a pleasure to see you today!  Thank you for trusting me with your gastrointestinal care!

## 2022-05-03 NOTE — Progress Notes (Addendum)
Chief Complaint: Discuss colonoscopy  HPI:    Hannah [name] is a 82 year old female with a past medical history as listed below including A-fib on Eliquis (05/02/2015 echo with LVEF 50-55%), CKD, OSA and multiple others, who presents to clinic today to discuss repeat colonoscopy.    10/12/2013 colonoscopy with recurrent 2 cm sessile serrated polyp removed from the ascending colon, diverticulosis and internal hemorrhoids.  Apparently had a talk with her physicians at the time about a hemicolectomy but she elected to undergo serial colonoscopies.    01/14/2015 colonoscopy with pediatric colonoscope for history of colon polyps.  There was a recurrent 1 cm ascending colon polyp, status post hot biopsy polypectomy and diverticulosis.  At that time they recommended follow-up.  Biopsies showed sessile serrated polyp.    Today, the patient presents to clinic and tells me that she has had colonoscopies every year and 1/2 to 2 years since they found a tubular adenoma that they tattooed.  At one point they recommended that she have a hemicolectomy but the surgeon did not think it was necessary.  Instead she has opted for frequent visualization of this area.  Her last colonoscopy was a year and 1/2-2 ago at the [place] but she wanted to change her care here.  Tells me she does sometimes have issues getting the prep down and gets nauseous has always had to use GoLytely prep in the past.  She is on Eliquis but has had to hold this before for procedures.    Denies fever, chills, weight loss, abdominal pain, nausea, vomiting, heartburn or reflux.  Past Medical History:  Diagnosis Date   A-fib ([place])    Arthritis    CKD (chronic kidney disease) stage 3, GFR 30-59 ml/min (HCC)    Colon polyps    Diverticulosis    DJD (degenerative joint disease)    HLD (hyperlipidemia)    no medication   Hx of cardiovascular stress test    Myoview 3/15: No ischemia EF 58%, low risk   Hx of echocardiogram    Echo 4/16: EF 55-60%, no  RWMA, mild BAE, atrial lipomatous hypertrophy, trivial MR, trivial TR   Hypertension    Intracranial hemorrhage (HCC)    Microscopic hematuria    OSA (obstructive sleep apnea)    uses CPAP occasionally   Spinal stenosis    chronic pain   Subdural hematoma ([place])    1987 - treated at [facility] in [place]    Past Surgical History:  Procedure Laterality Date   APPENDECTOMY     CARDIOVERSION N/A 01/18/2015   Procedure: CARDIOVERSION;  Surgeon: Pixie Casino, MD;  Location: [facility] ENDOSCOPY;  Service: Cardiovascular;  Laterality: N/A;   CHOLECYSTECTOMY     TEE WITHOUT CARDIOVERSION N/A 01/18/2015   Procedure: TRANSESOPHAGEAL ECHOCARDIOGRAM (TEE);  Surgeon: Pixie Casino, MD;  Location: [facility] ENDOSCOPY;  Service: Cardiovascular;  Laterality: N/A;    Current Outpatient Medications  Medication Sig Dispense Refill   albuterol (VENTOLIN HFA) 108 (90 Base) MCG/ACT inhaler Inhale 1-2 puffs into the lungs every 6 (six) hours as needed for wheezing or shortness of breath. 1 each 0   ALPRAZolam (XANAX) 0.25 MG tablet Take 1 tablet by mouth as needed for sleep.      amoxicillin-clavulanate (AUGMENTIN) 875-125 MG tablet Take 1 tablet by mouth every 12 (twelve) hours. 14 tablet 0   apixaban (ELIQUIS) 5 MG TABS tablet Take 1 tablet (5 mg total) by mouth 2 (two) times daily. appt needed for refills [phone] 60 tablet 0  azithromycin (ZITHROMAX) 250 MG tablet Take 1 tablet (250 mg total) by mouth daily. Take first 2 tablets together, then 1 every day until finished. 6 tablet 0   diltiazem (CARDIZEM) 30 MG tablet TAKE 1 TABLET BY MOUTH EVERY 4 HOURS AS NEEDED FOR HEART RATE OVER 110, APPOINTMENT REQUIRED FOR FURTHER REFILLS 45 tablet 1   diltiazem (TIAZAC) 120 MG 24 hr capsule Take 1 capsule (120 mg total) by mouth daily. 90 capsule 2   furosemide (LASIX) 20 MG tablet TAKE 1 TABLET BY MOUTH EVERY DAY (Patient taking differently: Take 20 mg by mouth as needed. ) 90 tablet 0   metoprolol succinate (TOPROL-XL)  25 MG 24 hr tablet TAKE 1 AND 1/2 TABLETS(37.5 MG) BY MOUTH TWICE DAILY 180 tablet 1   No current facility-administered medications for this visit.    Allergies as of 05/03/2022   (No Known Allergies)    Family History  Problem Relation Age of Onset   Cancer Mother    Heart attack Mother    Heart disease Mother    Hypertension Sister    Heart attack Father    Suicidality Father    Other Sister        subdural hematoma - brother and sister died   Stroke Sister    Diabetes Sister     Social History   Socioeconomic History   Marital status: Divorced    Spouse name: Not on file   Number of children: Not on file   Years of education: Not on file   Highest education level: Not on file  Occupational History   Not on file  Tobacco Use   Smoking status: Every Day    Types: Cigarettes   Smokeless tobacco: Never   Tobacco comments:    4 cigarettes per day  Substance and Sexual Activity   Alcohol use: Yes    Alcohol/week: 1.0 standard drink of alcohol    Types: 1 Standard drinks or equivalent per week    Comment: twice a week   Drug use: No   Sexual activity: Not on file  Other Topics Concern   Not on file  Social History Narrative   Not on file   Social Determinants of Health   Financial Resource Strain: Not on file  Food Insecurity: Not on file  Transportation Needs: Not on file  Physical Activity: Not on file  Stress: Not on file  Social Connections: Not on file  Intimate Partner Violence: Not on file    Review of Systems:    Constitutional: No weight loss, fever or chills Skin: No rash  Cardiovascular: No chest pain   Respiratory: No SOB  Gastrointestinal: See HPI and otherwise negative Genitourinary: No dysuria  Neurological: No headache, dizziness or syncope Musculoskeletal: No new muscle or joint pain Hematologic: No bleeding  Psychiatric: No history of depression or anxiety   Physical Exam:  Vital signs: BP 130/70   Pulse 95   Ht '5\' 1"'$  (1.549  m)   Wt 257 lb (116.6 kg)   SpO2 100%   BMI 48.56 kg/m    Constitutional:   Pleasant obese, elderly Caucasian female appears to be in NAD, Well developed, Well nourished, alert and cooperative Head:  Normocephalic and atraumatic. Eyes:   PEERL, EOMI. No icterus. Conjunctiva pink. Ears:  Normal auditory acuity. Neck:  Supple Throat: Oral cavity and pharynx without inflammation, swelling or lesion.  Respiratory: Respirations even and unlabored. Lungs clear to auscultation bilaterally.   No wheezes, crackles, or rhonchi.  Cardiovascular: Normal S1, S2. No MRG. Regular rate and rhythm. No peripheral edema, cyanosis or pallor.  Gastrointestinal:  Soft, nondistended, nontender. No rebound or guarding. Normal bowel sounds. No appreciable masses or hepatomegaly. Rectal:  Not performed.  Msk:  Symmetrical without gross deformities. Without edema, no deformity or joint abnormality. +ambulates with walker Neurologic:  Alert and  oriented x4;  grossly normal neurologically.  Skin:   Dry and intact without significant lesions or rashes. Psychiatric: Demonstrates good judgement and reason without abnormal affect or behaviors.  RELEVANT LABS AND IMAGING: CBC    Component Value Date/Time   WBC 6.3 05/01/2021 0450   RBC 4.52 05/01/2021 0450   HGB 14.3 05/01/2021 0450   HCT 43.9 05/01/2021 0450   PLT 198 05/01/2021 0450   MCV 97.1 05/01/2021 0450   MCH 31.6 05/01/2021 0450   MCHC 32.6 05/01/2021 0450   RDW 13.3 05/01/2021 0450   LYMPHSABS 1.2 05/01/2021 0450   MONOABS 0.6 05/01/2021 0450   EOSABS 0.0 05/01/2021 0450   BASOSABS 0.0 05/01/2021 0450    CMP     Component Value Date/Time   NA 135 05/01/2021 0450   K 4.8 05/01/2021 0450   CL 98 05/01/2021 0450   CO2 32 05/01/2021 0450   GLUCOSE 109 (H) 05/01/2021 0450   BUN 18 05/01/2021 0450   CREATININE 0.87 05/01/2021 0450   CALCIUM 9.7 05/01/2021 0450   PROT 7.1 05/11/2014 1642   ALBUMIN 4.1 05/11/2014 1642   AST 21 05/11/2014 1642    ALT 20 05/11/2014 1642   ALKPHOS 72 05/11/2014 1642   BILITOT 0.4 05/11/2014 1642   GFRNONAA >60 05/01/2021 0450   GFRAA 58 (L) 05/13/2019 1444    Assessment: 1.  History of adenomatous polyp of the colon: Reports last colonoscopy a year and 1/2-2 ago, apparently has repeat colonoscopies every year and 1/2-2 to observe this polyp 2.  A-fib on chronic anticoagulation: With Eliquis  Plan: 1.  Scheduled patient for surveillance colonoscopy in the [facility] with Dr. Bryan Lemma.  Did provide the patient a detailed list of risks for the procedure and she agrees to proceed.  May need to consider this being her last colonoscopy pending results. 2.  Patient advised to hold her Eliquis for 2 days prior to time of procedure.  We will communicate with the prescribing physician to ensure this is acceptable for her. 3.  Prescribed Reglan 10 mg twice, 20 to 30 minutes before each half of prep.  #2 with no refills. 4.  Patient to follow in clinic per recommendations from Dr. Bryan Lemma after time of procedure.  We will attempt to get her last records from the [place].  Hannah Newer, PA-C [facility] 05/03/2022, 11:31 AM   05/03/2022 addendum 3:40 PM  Received records from patient's last colonoscopy completed at the [place] in [place] on 04/09/2018.  Discussed history of large recurrent sessile serrated polyp at the same side of the ascending colon since 2011, there were findings of several small flat polyps overlying 15 mm submucosal lesion at the tattoo site in the ascending colon polyps were snared and "well biopsies" performed of submucosal lesion.  Biopsy showed a sessile serrated polyp without dysplasia and benign colonic mucosa from the ascending colon polyps x 2 and benign colonic mucosa with focal lamina propria tattoo pigment deposition in the ascending colon..  No change in plans.  JL L

## 2022-05-03 NOTE — Telephone Encounter (Signed)
Faxed to :[phone] @ [place], [place] December 31, 1940 [id number]  Dear Hannah [name]:  We have scheduled the above named patient for a(n) colonoscopy procedure. Our records show that (s)he is on anticoagulation therapy.  Please advise as to whether the patient may come off their therapy of Eliquis 2 days prior to their procedure which is scheduled for 07/06/2022.  Please route your response to [place] or fax response to [phone].  Sincerely,    [facility]

## 2022-05-04 NOTE — Progress Notes (Signed)
Agree with the assessment and plan as outlined by Ellouise Newer, PA-C.  Agree with plan to proceed with colonoscopy for ongoing surveillance.  I did see the addendum that describes the last colonoscopy at the [place] in 03/2018.  By that description, would appear that the 15 mm lesion was biopsied and demonstrated SSP, but not necessarily resected?  The polyps were otherwise benign tissue.  With her morbid obesity (BMI 48.5) and additional underlying comorbidities, along with the possibility that I may need to use a submucosal lifting agent if there is any remnant polyp that has been intervened on in the past, I recommend that this procedure be changed to [facility] where we have additional endoscopic capabilities.  Agree with plan for 2-day Eliquis hold pending clearance from prescribing physician.  Agree with prep plan as outlined.  Carnell Casamento, DO, [facility]

## 2022-05-08 NOTE — Telephone Encounter (Signed)
Called [facility] at [phone] and Lm checking on status of anti coag clearance request

## 2022-05-09 NOTE — Telephone Encounter (Signed)
Hannah [name] from the [place] received message and says that he sent the Eliquis hold to [place] and to advise once received.

## 2022-05-10 ENCOUNTER — Telehealth: Payer: Self-pay

## 2022-05-10 DIAGNOSIS — I4891 Unspecified atrial fibrillation: Secondary | ICD-10-CM

## 2022-05-10 DIAGNOSIS — Z86010 Personal history of colonic polyps: Secondary | ICD-10-CM

## 2022-05-10 NOTE — Telephone Encounter (Signed)
-----   Message from Levin Erp, [place] sent at 05/07/2022  8:43 AM EDT ----- Regarding: reschedule at hospital Procedure needs to be rescheduled at [facility] with Dr. Bryan Lemma. Thanks-JLL ----- Message ----- From: Lavena Bullion, DO Sent: 05/04/2022   4:32 PM EDT To: Levin Erp, PA     ----- Message ----- From: Levin Erp, [place] Sent: 05/03/2022   1:24 PM EST To: Lavena Bullion, DO

## 2022-05-11 NOTE — Telephone Encounter (Signed)
Called and LM for [facility] to resend clearance for Eliquis to different fax machine since 1824 has been broken: use [phone]

## 2022-05-14 NOTE — Telephone Encounter (Signed)
Called Hannah [name], says he put in a request for clearance to be resent, he let me know they haven't faxed over the clearance yet.

## 2022-05-17 NOTE — Telephone Encounter (Signed)
Called and spoke to [place]. Patient's last 4 are [id number]. He has requested it twice from the provider that indicated they would send the approval but we have not seen it; could be b/c our fax was broken. Nada Boozer indicated he would request that the provider send the approval directly to HIM so he could fax it to [place] [phone]) so he knows it was done.

## 2022-05-18 NOTE — Telephone Encounter (Signed)
Received fax from Francee Gentile, DO from the [facility] stating Eliquis can be stopped 2 days prior to the colonoscopy procedure.   Left message for patient to return my call.

## 2022-05-21 NOTE — Telephone Encounter (Signed)
Called and spoke to patient. She understands to hold Eliquis on 5-8 and 5-9 for procedure on 5-10

## 2022-05-23 ENCOUNTER — Other Ambulatory Visit: Payer: Self-pay

## 2022-05-23 DIAGNOSIS — Z86010 Personal history of colonic polyps: Secondary | ICD-10-CM

## 2022-05-23 DIAGNOSIS — I4891 Unspecified atrial fibrillation: Secondary | ICD-10-CM

## 2022-05-23 NOTE — Telephone Encounter (Signed)
Called and spoke with patient. Reviewed Dr. Vivia Ewing recommendation for changing location of colonoscopy. Patient's colonoscopy is now scheduled at [facility] on Tuesday, 07/03/22 at 11 am. Patient knows to arrive at 9:30 am with a care partner. Hannah [name] is aware that I will mail and send updated instructions. Hannah [name] knows that she will still need to hold Eliquis 2 days prior. Patient verbalized understanding and had no concerns at the end of the call.   [place] colonoscopy cancelled. Ambulatory referral to GI in Epic. Updated colonoscopy instructions mailed and sent to patient via [facility].

## 2022-06-26 ENCOUNTER — Encounter (HOSPITAL_COMMUNITY): Payer: Self-pay | Admitting: Gastroenterology

## 2022-06-26 NOTE — Progress Notes (Signed)
Hannah [name]  Prep instructions-understands  PCP- Shaune Pollack MD Cardiologist- Dr Marguarite Arbour in 2021) / [facility]  EKG-05/01/21 Echo-2017 Cath-n/a Stress- 2015 ICD/PM-n/a Blood thinner-Eliquis 2 day hold GLP-1-n/a  Hx:HTN, CKD 3, Afib, Intracranial hemorrhage, OSA. Saw Dr Johney Frame back in 2021 but has now transitioned to the [facility] for cardiology. I asked the last time she saw cardiology through the [place] she couldnt remember but thought it had been a while. She does endorse some SOB on exertion but no oxygen use. Cards [facility]ag [facility] letter for Eliquis to hold for 2 days preop.  Anesthesia Review: Yes

## 2022-06-27 ENCOUNTER — Encounter (HOSPITAL_COMMUNITY): Payer: Self-pay | Admitting: Physician Assistant

## 2022-06-28 ENCOUNTER — Telehealth: Payer: Self-pay | Admitting: Gastroenterology

## 2022-06-28 ENCOUNTER — Telehealth: Payer: Self-pay

## 2022-06-28 NOTE — Telephone Encounter (Signed)
Received a call from [facility] at [facility] informing me that patient has not been seen since 2017. See alternate telephone encounter for details.

## 2022-06-28 NOTE — Telephone Encounter (Signed)
Hannah [name] Jul 15, 1940 [id number]  Dear Dr. Shaune Pollack:  We have scheduled the above named patient for a colonoscopy procedure. We are requesting medical clearance to proceed. Colonoscopy is scheduled for Tuesday, 07/03/22.  Please fax response to Juanda Crumble, RN at [phone].  Sincerely,    [facility]

## 2022-06-28 NOTE — Telephone Encounter (Signed)
Returned call to patient. Pt is aware that she will need to arrive at [facility] by 9 am for a 10:30 am procedure on 07/03/22. Patient also states that [facility] and she has not [facility] them in about a year (See previous telephone encounter. Anesthesia requested PCP clearance). Message sent to anesthesia and Endo nurse to see wether patient still needs PCP clearance since cardiac clearance has already been obtained.

## 2022-06-28 NOTE — Telephone Encounter (Signed)
Patient called regarding her arrival time for her procedure at [facility] on 05/07. She states on the documents that she has it states for her to arrive at 7 but she states she remembers someone telling her to arrive at 9:30. She would just like to clarify the time. Please advise, thank you.

## 2022-06-28 NOTE — Telephone Encounter (Signed)
Clearance letter printed and faxed to PCP at [facility] @ [place] (P: [phone], Fax: [phone]).

## 2022-06-28 NOTE — Telephone Encounter (Signed)
Per Christeen Douglas, PA-C no need for PCP clearance if cardiac clearance has been obtained. SEE 05/03/22 telephone encounter for cardiac clearance.

## 2022-07-02 NOTE — Anesthesia Preprocedure Evaluation (Signed)
Anesthesia Evaluation    Reviewed: Allergy & Precautions, Patient's Chart, lab work & pertinent test results  Airway        Dental   Pulmonary asthma , sleep apnea , Current Smoker          Cardiovascular hypertension, Pt. on medications + dysrhythmias Atrial Fibrillation      Neuro/Psych    GI/Hepatic diverticulosis   Endo/Other    Renal/GU Renal InsufficiencyRenal disease     Musculoskeletal   Abdominal  (+) + obese  Peds  Hematology   Anesthesia Other Findings   Reproductive/Obstetrics                             Anesthesia Physical Anesthesia Plan  ASA: 3  Anesthesia Plan: MAC   Post-op Pain Management:    Induction:   PONV Risk Score and Plan: Treatment may vary due to age or medical condition and Propofol infusion  Airway Management Planned: Natural Airway and Nasal Cannula  Additional Equipment: None  Intra-op Plan:   Post-operative Plan:   Informed Consent:      Dental advisory given  Plan Discussed with:   Anesthesia Plan Comments: (hx of adenomatous polyps)        Anesthesia Quick Evaluation

## 2022-07-03 ENCOUNTER — Telehealth: Payer: Self-pay

## 2022-07-03 ENCOUNTER — Ambulatory Visit (HOSPITAL_COMMUNITY): Admission: RE | Admit: 2022-07-03 | Payer: Medicare Other | Source: Home / Self Care | Admitting: Gastroenterology

## 2022-07-03 ENCOUNTER — Encounter (HOSPITAL_COMMUNITY): Admission: RE | Payer: Self-pay | Source: Home / Self Care

## 2022-07-03 SURGERY — COLONOSCOPY WITH PROPOFOL
Anesthesia: Monitor Anesthesia Care

## 2022-07-03 NOTE — H&P (Signed)
GASTROENTEROLOGY PROCEDURE H&P NOTE   Primary Care Physician: Pcp, No    Reason for Procedure:  History of colon polyps, colon polyp surveillance  Plan:    Colonoscopy was scheduled for today, but unfortunately due to error in paperwork, patient not coming today.  Will plan to reschedule colonoscopy at [facility] for future date.  Note will be closed out.      HPI: Hannah [name] is a 82 y.o. female who was scheduled today for colonoscopy for ongoing polyp surveillance.  Due to elevated periprocedural risks from underlying comorbidities along with the potential need for Endoscopic Mucosal Resection (EMR) of a right-sided persistent/recurrent polyp, procedure was scheduled at [facility] unit for additional endoscopic capabilities as well.  Unfortunately, she had an error in her paperwork and thought the procedure was actually scheduled for 07/06/2022, and therefore has not completed any bowel preparation or medication hold for procedure today (Eliquis).  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  - 09/2013: Recurrent 2 cm SSP removed from the ascending colon.  Sigmoid diverticulosis, internal hemorrhoids.  Discussed right hemicolectomy vs surveillance colonoscopies, and patient opted for the latter - 12/2014: 1 cm SSP removed from the ascending colon with hot biopsy forceps.  Sigmoid diverticulosis - 04/07/2018: 3 small 3-5 mm SSP's located adjacent to a tattoo site in the ascending colon, removed with snare polypectomy.  These polyps were all overlying a 15 mm submucosal lesion which was biopsied and benign.  Sigmoid diverticulosis, internal hemorrhoids.  Recommended repeat in 2 years.  She is otherwise without any GI symptoms.  Past Medical History:  Diagnosis Date   A-fib (HCC)    Arthritis    CKD (chronic kidney disease)  stage 3, GFR 30-59 ml/min (HCC)    Colon polyps    Diverticulosis    DJD (degenerative joint disease)    HLD (hyperlipidemia)    no medication   Hx of cardiovascular stress test    Myoview 3/15: No ischemia EF 58%, low risk   Hx of echocardiogram    Echo 4/16: EF 55-60%, no RWMA, mild BAE, atrial lipomatous hypertrophy, trivial MR, trivial TR   Hypertension    Intracranial hemorrhage (HCC)    Microscopic hematuria    OSA (obstructive sleep apnea)    uses CPAP occasionally   Spinal stenosis    chronic pain   Subdural hematoma (HCC)    1987 - treated at [facility] in [place]    Past Surgical History:  Procedure Laterality Date   APPENDECTOMY     CARDIOVERSION N/A 01/18/2015   Procedure: CARDIOVERSION;  Surgeon: Chrystie Nose, MD;  Location: [facility] ENDOSCOPY;  Service: Cardiovascular;  Laterality: N/A;   CHOLECYSTECTOMY     TEE WITHOUT CARDIOVERSION N/A 01/18/2015   Procedure: TRANSESOPHAGEAL ECHOCARDIOGRAM (TEE);  Surgeon: Chrystie Nose, MD;  Location: [facility] ENDOSCOPY;  Service: Cardiovascular;  Laterality: N/A;    Prior to Admission medications   Medication Sig Start Date End Date Taking? Authorizing Provider  acetaminophen (TYLENOL) 500 MG tablet Take 1,000 mg by mouth 2 (two) times daily.   Yes [provider]  apixaban (ELIQUIS) 5 MG TABS tablet Take 1 tablet (5 mg total) by mouth 2 (two) times daily. appt needed for refills [phone] 04/22/20  Yes Newman Nip, NP  diclofenac Sodium (VOLTAREN ARTHRITIS PAIN) 1 % GEL Apply 2 g topically daily  as needed (Pain).   Yes [provider]  diltiazem (TIAZAC) 120 MG 24 hr capsule Take 1 capsule (120 mg total) by mouth daily. Patient taking differently: Take 180 mg by mouth daily. 06/05/19  Yes Newman Nip, NP  metoprolol tartrate (LOPRESSOR) 50 MG tablet Take 50 mg by mouth 2 (two) times daily. 12/14/21  Yes [provider]  Polyvinyl Alcohol-Povidone PF (REFRESH) 1.4-0.6 % SOLN Place 1 drop into both eyes  daily as needed (Dry eyes).   Yes [provider]  albuterol (VENTOLIN HFA) 108 (90 Base) MCG/ACT inhaler Inhale 1-2 puffs into the lungs every 6 (six) hours as needed for wheezing or shortness of breath. Patient not taking: Reported on 06/29/2022 05/01/21   Horton, Mayer Masker, MD  diltiazem (CARDIZEM) 30 MG tablet TAKE 1 TABLET BY MOUTH EVERY 4 HOURS AS NEEDED FOR HEART RATE OVER 110, APPOINTMENT REQUIRED FOR FURTHER REFILLS Patient not taking: Reported on 06/29/2022 03/16/19   Newman Nip, NP  furosemide (LASIX) 20 MG tablet TAKE 1 TABLET BY MOUTH EVERY DAY Patient not taking: Reported on 06/29/2022 03/12/19   Newman Nip, NP  metoCLOPramide (REGLAN) 10 MG tablet Please take 1 tablet by mouth 20 minutes before colon prep. 05/03/22   Unk Lightning, PA  PEG-KCl-NaCl-NaSulf-Na Asc-C (PLENVU) 140 g SOLR Take 1 kit by mouth as directed. Use coupon: BIN: [id number] [facility]: CNRX Group: [id number] ID: [id number] 05/03/22   Unk Lightning, PA    No current facility-administered medications for this encounter.   Current Outpatient Medications  Medication Sig Dispense Refill   acetaminophen (TYLENOL) 500 MG tablet Take 1,000 mg by mouth 2 (two) times daily.     apixaban (ELIQUIS) 5 MG TABS tablet Take 1 tablet (5 mg total) by mouth 2 (two) times daily. appt needed for refills [phone] 60 tablet 0   diclofenac Sodium (VOLTAREN ARTHRITIS PAIN) 1 % GEL Apply 2 g topically daily as needed (Pain).     diltiazem (TIAZAC) 120 MG 24 hr capsule Take 1 capsule (120 mg total) by mouth daily. (Patient taking differently: Take 180 mg by mouth daily.) 90 capsule 2   metoprolol tartrate (LOPRESSOR) 50 MG tablet Take 50 mg by mouth 2 (two) times daily.     Polyvinyl Alcohol-Povidone PF (REFRESH) 1.4-0.6 % SOLN Place 1 drop into both eyes daily as needed (Dry eyes).     albuterol (VENTOLIN HFA) 108 (90 Base) MCG/ACT inhaler Inhale 1-2 puffs into the lungs every 6 (six) hours as needed for wheezing or  shortness of breath. (Patient not taking: Reported on 06/29/2022) 1 each 0   diltiazem (CARDIZEM) 30 MG tablet TAKE 1 TABLET BY MOUTH EVERY 4 HOURS AS NEEDED FOR HEART RATE OVER 110, APPOINTMENT REQUIRED FOR FURTHER REFILLS (Patient not taking: Reported on 06/29/2022) 45 tablet 1   furosemide (LASIX) 20 MG tablet TAKE 1 TABLET BY MOUTH EVERY DAY (Patient not taking: Reported on 06/29/2022) 90 tablet 0   metoCLOPramide (REGLAN) 10 MG tablet Please take 1 tablet by mouth 20 minutes before colon prep. 2 tablet 0   PEG-KCl-NaCl-NaSulf-Na Asc-C (PLENVU) 140 g SOLR Take 1 kit by mouth as directed. Use coupon: BIN: [id number] [facility]: CNRX Group: [id number] ID: [id number] 1 each 0    Allergies as of 05/23/2022   (No Known Allergies)    Family History  Problem Relation Age of Onset   Cancer Mother    Heart attack Mother    Heart disease Mother    Hypertension Sister  Heart attack Father    Suicidality Father    Other Sister        subdural hematoma - brother and sister died   Stroke Sister    Diabetes Sister     Social History   Socioeconomic History   Marital status: Divorced    Spouse name: Not on file   Number of children: Not on file   Years of education: Not on file   Highest education level: Not on file  Occupational History   Not on file  Tobacco Use   Smoking status: Every Day    Types: Cigarettes   Smokeless tobacco: Never   Tobacco comments:    4 cigarettes per day  Substance and Sexual Activity   Alcohol use: Yes    Alcohol/week: 1.0 standard drink of alcohol    Types: 1 Standard drinks or equivalent per week    Comment: twice a week   Drug use: No   Sexual activity: Not on file  Other Topics Concern   Not on file  Social History Narrative   Not on file   Social Determinants of Health   Financial Resource Strain: Not on file  Food Insecurity: Not on file  Transportation Needs: Not on file  Physical Activity: Not on file  Stress: Not on file  Social Connections: Not  on file  Intimate Partner Violence: Not on file     Doristine Locks, [place] [facility]   07/03/2022 9:01 AM

## 2022-07-03 NOTE — Telephone Encounter (Signed)
Need to reschedule hospital colonoscopy appt.

## 2022-07-04 ENCOUNTER — Encounter: Payer: Self-pay | Admitting: Gastroenterology

## 2022-07-04 NOTE — Telephone Encounter (Signed)
Patient called into the office to reschedule hospital colonoscopy. Patient has been scheduled for a telephone PV on 08/22/22 at 10 am. Colonoscopy has been rescheduled to 09/05/22 at 9:15 am [facility]). Patient has been advised that she will need to arrive at [facility] by 7:45 am with a care partner. I informed patient that I will mail her the new appts. Pt confirmed address on file. Pt verbalized understanding and had no concerns at the end of the call.

## 2022-07-06 ENCOUNTER — Encounter: Payer: No Typology Code available for payment source | Admitting: Gastroenterology

## 2022-08-20 ENCOUNTER — Telehealth: Payer: Self-pay

## 2022-08-20 NOTE — Telephone Encounter (Signed)
Called patient to reschedule hospital colonoscopy. Informed patient that Dr. Barron Alvine is not available on 09/05/22. Patient has been rescheduled for Pre visit with a Nurse on 09/24/22 at 2:00 PM. Colonoscopy has been rescheduled to 10/10/22 at 10:30 AM am [facility]). I informed patient that I will mail her the new appts. Address is confirmed with the patient. Pt verbalized understanding and had no concerns at the end of the call.

## 2022-09-24 ENCOUNTER — Ambulatory Visit (AMBULATORY_SURGERY_CENTER): Payer: No Typology Code available for payment source

## 2022-09-24 VITALS — Ht 61.0 in | Wt 236.0 lb

## 2022-09-24 DIAGNOSIS — Z86010 Personal history of colonic polyps: Secondary | ICD-10-CM

## 2022-09-24 NOTE — Progress Notes (Signed)
No egg or soy allergy known to patient  No issues known to pt with past sedation with any surgeries or procedures Patient denies ever being told they had issues or difficulty with intubation  No FH of Malignant Hyperthermia Pt is not on diet pills Pt is not on  home 02  Pt is not on blood thinners  Pt denies issues with constipation  Hx A- FIB on eliquis  Have any cardiac testing pending-- no  LOA: independent  Prep: plenvu sample  PV competed with patient. Prep instructions sent via mychart and hard copy given at visit

## 2022-10-03 ENCOUNTER — Encounter (HOSPITAL_COMMUNITY): Payer: Self-pay | Admitting: Gastroenterology

## 2022-10-10 ENCOUNTER — Encounter (HOSPITAL_COMMUNITY): Payer: Self-pay | Admitting: Gastroenterology

## 2022-10-10 ENCOUNTER — Other Ambulatory Visit: Payer: Self-pay

## 2022-10-10 ENCOUNTER — Ambulatory Visit (HOSPITAL_COMMUNITY): Payer: No Typology Code available for payment source

## 2022-10-10 ENCOUNTER — Encounter (HOSPITAL_COMMUNITY)
Admission: RE | Disposition: A | Discharge status: Home / Self Care | Payer: Self-pay | Source: Home / Self Care | Attending: Gastroenterology

## 2022-10-10 ENCOUNTER — Ambulatory Visit (HOSPITAL_COMMUNITY)
Admission: RE | Admit: 2022-10-10 | Discharge: 2022-10-10 | Disposition: A | Discharge status: Home / Self Care | Payer: No Typology Code available for payment source | Attending: Gastroenterology | Admitting: Gastroenterology

## 2022-10-10 DIAGNOSIS — I480 Paroxysmal atrial fibrillation: Secondary | ICD-10-CM | POA: Diagnosis not present

## 2022-10-10 DIAGNOSIS — K621 Rectal polyp: Secondary | ICD-10-CM | POA: Diagnosis not present

## 2022-10-10 DIAGNOSIS — Z860100 Personal history of colon polyps, unspecified: Secondary | ICD-10-CM

## 2022-10-10 DIAGNOSIS — K641 Second degree hemorrhoids: Secondary | ICD-10-CM

## 2022-10-10 DIAGNOSIS — N183 Chronic kidney disease, stage 3 unspecified: Secondary | ICD-10-CM | POA: Diagnosis not present

## 2022-10-10 DIAGNOSIS — M1990 Unspecified osteoarthritis, unspecified site: Secondary | ICD-10-CM | POA: Insufficient documentation

## 2022-10-10 DIAGNOSIS — Z09 Encounter for follow-up examination after completed treatment for conditions other than malignant neoplasm: Secondary | ICD-10-CM

## 2022-10-10 DIAGNOSIS — K5730 Diverticulosis of large intestine without perforation or abscess without bleeding: Secondary | ICD-10-CM | POA: Insufficient documentation

## 2022-10-10 DIAGNOSIS — Z7901 Long term (current) use of anticoagulants: Secondary | ICD-10-CM | POA: Diagnosis not present

## 2022-10-10 DIAGNOSIS — I129 Hypertensive chronic kidney disease with stage 1 through stage 4 chronic kidney disease, or unspecified chronic kidney disease: Secondary | ICD-10-CM | POA: Diagnosis not present

## 2022-10-10 DIAGNOSIS — D122 Benign neoplasm of ascending colon: Secondary | ICD-10-CM | POA: Diagnosis not present

## 2022-10-10 DIAGNOSIS — Z6841 Body Mass Index (BMI) 40.0 and over, adult: Secondary | ICD-10-CM | POA: Diagnosis not present

## 2022-10-10 DIAGNOSIS — Z79899 Other long term (current) drug therapy: Secondary | ICD-10-CM | POA: Insufficient documentation

## 2022-10-10 DIAGNOSIS — I4891 Unspecified atrial fibrillation: Secondary | ICD-10-CM | POA: Diagnosis not present

## 2022-10-10 DIAGNOSIS — K635 Polyp of colon: Secondary | ICD-10-CM | POA: Diagnosis not present

## 2022-10-10 DIAGNOSIS — G4733 Obstructive sleep apnea (adult) (pediatric): Secondary | ICD-10-CM | POA: Insufficient documentation

## 2022-10-10 DIAGNOSIS — K644 Residual hemorrhoidal skin tags: Secondary | ICD-10-CM | POA: Insufficient documentation

## 2022-10-10 DIAGNOSIS — D128 Benign neoplasm of rectum: Secondary | ICD-10-CM | POA: Diagnosis not present

## 2022-10-10 DIAGNOSIS — F17210 Nicotine dependence, cigarettes, uncomplicated: Secondary | ICD-10-CM | POA: Insufficient documentation

## 2022-10-10 HISTORY — PX: HEMOSTASIS CONTROL: SHX6838

## 2022-10-10 HISTORY — PX: POLYPECTOMY: SHX5525

## 2022-10-10 HISTORY — PX: SUBMUCOSAL LIFTING INJECTION: SHX6855

## 2022-10-10 HISTORY — PX: COLONOSCOPY WITH PROPOFOL: SHX5780

## 2022-10-10 SURGERY — COLONOSCOPY WITH PROPOFOL
Anesthesia: Monitor Anesthesia Care

## 2022-10-10 MED ORDER — PROPOFOL 1000 MG/100ML IV EMUL
INTRAVENOUS | Status: AC
  Filled 2022-10-10: qty 100

## 2022-10-10 MED ORDER — PHENYLEPHRINE HCL (PRESSORS) 10 MG/ML IV SOLN
INTRAVENOUS | Status: DC | PRN
  Administered 2022-10-10 (×3): 80 ug via INTRAVENOUS

## 2022-10-10 MED ORDER — LIDOCAINE HCL (PF) 2 % IJ SOLN
INTRAMUSCULAR | Status: DC | PRN
  Administered 2022-10-10: 50 mg via INTRADERMAL

## 2022-10-10 MED ORDER — PROPOFOL 500 MG/50ML IV EMUL
INTRAVENOUS | Status: DC | PRN
  Administered 2022-10-10: 100 ug/kg/min via INTRAVENOUS

## 2022-10-10 MED ORDER — PROPOFOL 10 MG/ML IV BOLUS
INTRAVENOUS | Status: DC | PRN
  Administered 2022-10-10: 50 mg via INTRAVENOUS

## 2022-10-10 MED ORDER — LACTATED RINGERS IV SOLN
INTRAVENOUS | Status: AC | PRN
  Administered 2022-10-10: 1000 mL via INTRAVENOUS

## 2022-10-10 SURGICAL SUPPLY — 22 items

## 2022-10-10 NOTE — Op Note (Signed)
[facility] Patient Name: Hannah [name] Procedure Date: 10/10/2022 MRN: [id number] Attending MD: Doristine Locks , MD, [id number] Date of Birth: 1940-12-15 CSN: [id number] Age: 82 Admit Type: Outpatient Procedure:                Colonoscopy Indications:              High risk colon cancer surveillance: Personal                            history of sessile serrated colon polyp (10 mm or                            greater in size)                           Has had several colonoscopies at the [place]. Last                            colonoscopy was in 03/2018 and was notable for 15 mm                            SSP in the ascending colon that appears to have                            been biopsied, but not necessarily resected. This                            was in an area of a previously placed tattoo. Has                            had several colonoscopies dating back to 2011 all                            reporting an SSP in the ascending colon and several                            other flat polyps overlying a 15 mm submucosal                            lesion at a tattoo site which had been snared                            (SSP's) along with "well biopsy" of the submucosal                            lesion (benign focal lamina propria). Providers:                Doristine Locks, MD, Geralyn Corwin, RN, Marja Kays, Technician Referring MD:              Medicines:  Monitored Anesthesia Care Complications:            No immediate complications. Estimated Blood Loss:     Estimated blood loss was minimal. Procedure:                Pre-Anesthesia Assessment:                           - Prior to the procedure, a History and Physical                            was performed, and patient medications and                            allergies were reviewed. The patient's tolerance of                            previous  anesthesia was also reviewed. The risks                            and benefits of the procedure and the sedation                            options and risks were discussed with the patient.                            All questions were answered, and informed consent                            was obtained. Prior Anticoagulants: The patient has                            taken Eliquis (apixaban), last dose was 2 days                            prior to procedure. ASA Grade Assessment: III - A                            patient with severe systemic disease. After                            reviewing the risks and benefits, the patient was                            deemed in satisfactory condition to undergo the                            procedure.                           After obtaining informed consent, the colonoscope                            was passed under direct vision. Throughout the  procedure, the patient's blood pressure, pulse, and                            oxygen saturations were monitored continuously. The                            CF-HQ190L ([id number]) Olympus colonoscope was                            introduced through the anus and advanced to the the                            cecum, identified by appendiceal orifice and                            ileocecal valve. The colonoscopy was performed                            without difficulty. The patient tolerated the                            procedure well. The quality of the bowel                            preparation was good. The ileocecal valve,                            appendiceal orifice, and rectum were photographed. Scope In: 10:25:58 AM Scope Out: 11:07:48 AM Scope Withdrawal Time: 0 hours 38 minutes 54 seconds  Total Procedure Duration: 0 hours 41 minutes 50 seconds  Findings:      Skin tags were found on perianal exam.      A 12 mm polyp was found in the ascending colon. The  polyp was sessile       with scarring from prior biopsies and/or polypectomy attempts.       Preparations were made for Endoscopic Mucosal Resection (EMR) using       injection-lift and hot snare technique. First the polyp was lifted with       3 cc of Everlift with appropriate submucosal pad. The polyp was then       resected with a hot snare. The edges were then further ablated using the       snare tip. Resection and retrieval were complete. To close a defect       after polypectomy and for post polypectomy prophylaxis (particularly       given the location and need to restart anticoagulation), three       hemostatic clips were successfully placed (MR conditional). Clip       manufacturer: [employer]. There was no bleeding at the end of the       procedure.      One submucosal nodule was found in the ascending colon. This was located       immediately proximal to the ascending colon polyp. There was an adjacent       tattoo with apparent scar from prior endoscopic intervention. That scar       otherwise appeared well healed and healthy, without residual  polyp in       that location. The submucosal nodule has otherwise been previously       biopsied and benign.      Multiple small-mouthed diverticula were found in the sigmoid colon.      Non-bleeding internal hemorrhoids were found during retroflexion. The       hemorrhoids were small and Grade II (internal hemorrhoids that prolapse       but reduce spontaneously).      Three sessile polyps were found in the rectum. The polyps were 2 to 3 mm       in size. These polyps were removed with a cold snare. Resection and       retrieval were complete. Estimated blood loss was minimal. Impression:               - Perianal skin tags found on perianal exam.                           - One 12 mm polyp in the ascending colon, removed                            using injection-lift and a hot snare followed by                            snare  tip ablation of the polypectomy edges.                            Resected and retrieved. Clips (MR conditional) were                            placed for both prophylaxis and closure of the                            polypectomy site. Clip manufacturer: [employer].                           - Submucosal nodule in the ascending colon.                           - Tattoo with scar in the ascending colon. The scar                            within the tattoo otherwise appeared well healed                            and healthy.                           - Diverticulosis in the sigmoid colon.                           - Non-bleeding internal hemorrhoids.                           -  Three 2 to 3 mm polyps in the rectum, removed                            with a cold snare. Resected and retrieved. Moderate Sedation:      Not Applicable - Patient had care per Anesthesia. Recommendation:           - Patient has a contact number available for                            emergencies. The signs and symptoms of potential                            delayed complications were discussed with the                            patient. Return to normal activities tomorrow.                            Written discharge instructions were provided to the                            patient.                           - Resume previous diet.                           - Continue present medications.                           - Await pathology results.                           - Repeat colonoscopy for surveillance based on                            pathology results.                           - Resume Eliquis (apixaban) at prior dose tomorrow.                           - Return to GI clinic PRN. Procedure Code(s):        --- Professional ---                           [phone], Colonoscopy, flexible; with removal of                            tumor(s), polyp(s), or other lesion(s) by  snare                            technique                           [id number], Colonoscopy, flexible; with directed  submucosal injection(s), any substance Diagnosis Code(s):        --- Professional ---                           Z86.010, Personal history of colonic polyps                           D12.2, Benign neoplasm of ascending colon                           K63.89, Other specified diseases of intestine                           K64.1, Second degree hemorrhoids                           D12.8, Benign neoplasm of rectum                           K64.4, Residual hemorrhoidal skin tags                           K57.30, Diverticulosis of large intestine without                            perforation or abscess without bleeding CPT copyright 2022 American Medical Association. All rights reserved. The codes documented in this report are preliminary and upon coder review may  be revised to meet current compliance requirements. Doristine Locks, MD 10/10/2022 11:26:42 AM Number of Addenda: 0

## 2022-10-10 NOTE — Anesthesia Postprocedure Evaluation (Signed)
Anesthesia Post Note  Patient: Hannah [name]  Procedure(s) Performed: COLONOSCOPY WITH PROPOFOL SUBMUCOSAL LIFTING INJECTION POLYPECTOMY HEMOSTASIS CONTROL     Patient location during evaluation: Endoscopy Anesthesia Type: MAC Level of consciousness: oriented, awake and alert and awake Pain management: pain level controlled Vital Signs Assessment: post-procedure vital signs reviewed and stable Respiratory status: spontaneous breathing, nonlabored ventilation, respiratory function stable and patient connected to nasal cannula oxygen Cardiovascular status: blood pressure returned to baseline and stable Postop Assessment: no headache, no backache and no apparent nausea or vomiting Anesthetic complications: no   No notable events documented.  Last Vitals:  Vitals:   10/10/22 1120 10/10/22 1130  BP: (!) 104/47 129/62  Pulse: (!) 145 100  Resp: 17 (!) 21  Temp:    SpO2: 93% 94%    Last Pain:  Vitals:   10/10/22 1130  TempSrc:   PainSc: 0-No pain                 Collene Schlichter

## 2022-10-10 NOTE — Anesthesia Preprocedure Evaluation (Addendum)
Anesthesia Evaluation  Patient identified by MRN, date of birth, ID band Patient awake    Reviewed: Allergy & Precautions, NPO status , Patient's Chart, lab work & pertinent test results, reviewed documented beta blocker date and time   Airway Mallampati: II  TM Distance: >3 FB Neck ROM: Full    Dental  (+) Dental Advisory Given, Lower Dentures, Upper Dentures   Pulmonary sleep apnea and Continuous Positive Airway Pressure Ventilation , Current Smoker   Pulmonary exam normal breath sounds clear to auscultation       Cardiovascular hypertension, Pt. on home beta blockers + dysrhythmias Atrial Fibrillation  Rhythm:Irregular Rate:Abnormal     Neuro/Psych negative neurological ROS     GI/Hepatic Neg liver ROS,,,hx of adenomatous polyps   Endo/Other    Morbid obesity  Renal/GU Renal InsufficiencyRenal disease     Musculoskeletal  (+) Arthritis ,    Abdominal   Peds  Hematology  (+) Blood dyscrasia (Eliquis)   Anesthesia Other Findings   Reproductive/Obstetrics                              Anesthesia Physical Anesthesia Plan  ASA: 3  Anesthesia Plan: MAC   Post-op Pain Management: Minimal or no pain anticipated   Induction: Intravenous  PONV Risk Score and Plan: 1 and TIVA and Treatment may vary due to age or medical condition  Airway Management Planned: Natural Airway and Simple Face Mask  Additional Equipment:   Intra-op Plan:   Post-operative Plan:   Informed Consent: I have reviewed the patients History and Physical, chart, labs and discussed the procedure including the risks, benefits and alternatives for the proposed anesthesia with the patient or authorized representative who has indicated his/her understanding and acceptance.     Dental advisory given  Plan Discussed with: CRNA  Anesthesia Plan Comments:          Anesthesia Quick Evaluation

## 2022-10-10 NOTE — H&P (Signed)
GASTROENTEROLOGY PROCEDURE H&P NOTE   Primary Care Physician: Albina Billet, DO    Reason for Procedure:  Colon polyp surveillance  Plan:    Colonoscopy   Patient is appropriate for endoscopic procedure(s) at [facility].  The nature of the procedure, as well as the risks, benefits, and alternatives were carefully and thoroughly reviewed with the patient. Ample time for discussion and questions allowed. The patient understood, was satisfied, and agreed to proceed.     HPI: Hannah [name] is a 82 y.o. female who presents for colonoscopy for ongoing colon polyp surveillance.  Has had several colonoscopies at the [place].  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  This was in an area of a previously placed tattoo.  Has had several colonoscopies dating back to 2011 all reporting an SSP in the ascending colon and several other flat polyps overlying a 15 mm submucosal lesion at a tattoo site which had been snared (SSP's) along with "well biopsy" of the submucosal lesion (benign focal lamina propria).  Polyps have measured 1-2 cm by reports.  [facility] past, but she opted for continued surveillance colonoscopies.  Has been holding her Eliquis x 2 days in anticipation of colonoscopy today.  Procedure scheduled at [facility] due to elevated periprocedural risks from underlying comorbidities, to include morbid obesity, A-fib on chronic anticoagulation, OSA, and advanced age.  Past Medical History:  Diagnosis Date   A-fib (HCC)    Arthritis    CKD (chronic kidney disease) stage 3, GFR 30-59 ml/min (HCC)    Colon polyps    Diverticulosis    DJD (degenerative joint disease)    HLD (hyperlipidemia)    no medication   Hx of cardiovascular stress test    Myoview 3/15: No ischemia EF  58%, low risk   Hx of echocardiogram    Echo 4/16: EF 55-60%, no RWMA, mild BAE, atrial lipomatous hypertrophy, trivial MR, trivial TR   Hypertension    Intracranial hemorrhage (HCC)    Microscopic hematuria    OSA (obstructive sleep apnea)    uses CPAP occasionally   Sleep apnea    Spinal stenosis    chronic pain   Subdural hematoma (HCC)    1987 - treated at [facility] in [place]    Past Surgical History:  Procedure Laterality Date   APPENDECTOMY     CARDIOVERSION N/A 01/18/2015   Procedure: CARDIOVERSION;  Surgeon: Chrystie Nose, MD;  Location: [facility] ENDOSCOPY;  Service: Cardiovascular;  Laterality: N/A;   CHOLECYSTECTOMY     TEE WITHOUT CARDIOVERSION N/A 01/18/2015   Procedure: TRANSESOPHAGEAL ECHOCARDIOGRAM (TEE);  Surgeon: Chrystie Nose, MD;  Location: [facility] ENDOSCOPY;  Service: Cardiovascular;  Laterality: N/A;    Prior to Admission medications   Medication Sig Start Date End Date Taking? Authorizing Provider  acetaminophen (TYLENOL) 500 MG tablet Take 1,000 mg by mouth 2 (two) times daily.    [provider]  albuterol (VENTOLIN HFA) 108 (90 Base) MCG/ACT inhaler Inhale 1-2 puffs into the lungs every 6 (six) hours as needed for wheezing or shortness of breath. Patient not taking: Reported on 06/29/2022 05/01/21   Horton, Mayer Masker, MD  ammonium lactate (LAC-HYDRIN) 12 % lotion Apply 1 Application topically as needed. 07/26/22   [provider]  apixaban Everlene Balls) 5  MG TABS tablet Take 1 tablet (5 mg total) by mouth 2 (two) times daily. appt needed for refills [phone] 04/22/20   Newman Nip, NP  diclofenac Sodium (VOLTAREN ARTHRITIS PAIN) 1 % GEL Apply 2 g topically daily as needed (Pain). Patient not taking: Reported on 09/24/2022    [provider]  diltiazem (CARDIZEM) 30 MG tablet TAKE 1 TABLET BY MOUTH EVERY 4 HOURS AS NEEDED FOR HEART RATE OVER 110, APPOINTMENT REQUIRED FOR FURTHER REFILLS Patient not taking: Reported on 06/29/2022 03/16/19    Newman Nip, NP  diltiazem [facility]) 120 MG 24 hr capsule Take 1 capsule (120 mg total) by mouth daily. Patient taking differently: Take 180 mg by mouth daily. 06/05/19   Newman Nip, NP  furosemide (LASIX) 20 MG tablet TAKE 1 TABLET BY MOUTH EVERY DAY Patient not taking: Reported on 06/29/2022 03/12/19   Newman Nip, NP  gabapentin (NEURONTIN) 300 MG capsule Take 300 mg by mouth 3 (three) times daily.    [provider]  metoCLOPramide (REGLAN) 10 MG tablet Please take 1 tablet by mouth 20 minutes before colon prep. 05/03/22   Unk Lightning, PA  metoprolol tartrate (LOPRESSOR) 50 MG tablet Take 50 mg by mouth 2 (two) times daily. 12/14/21   [provider]  PEG-KCl-NaCl-NaSulf-Na Asc-C (PLENVU) 140 g SOLR Take 1 kit by mouth as directed. Use coupon: BIN: [id number] PNC: CNRX Group: [id number] ID: [id number] Patient taking differently: Take 1 kit by mouth as directed. Use coupon: BIN: [id number] PNC: CNRX Group: [id number] ID: [id number] not started 05/03/22   Unk Lightning, PA  Polyvinyl Alcohol-Povidone PF (REFRESH) 1.4-0.6 % SOLN Place 1 drop into both eyes daily as needed (Dry eyes).    [provider]  sertraline (ZOLOFT) 100 MG tablet Take 50 mg by mouth daily. 08/24/22   [provider]    No current facility-administered medications for this encounter.    Allergies as of 07/04/2022   (No Known Allergies)    Family History  Problem Relation Age of Onset   Cancer Mother    Heart attack Mother    Heart disease Mother    Heart attack Father    Suicidality Father    Hypertension Sister    Other Sister        subdural hematoma - brother and sister died   Stroke Sister    Diabetes Sister    Colon polyps Neg Hx    Rectal cancer Neg Hx    Stomach cancer Neg Hx     Social History   Socioeconomic History   Marital status: Divorced    Spouse name: Not on file   Number of children: Not on file   Years of education: Not on file    Highest education level: Not on file  Occupational History   Not on file  Tobacco Use   Smoking status: Every Day    Types: Cigarettes   Smokeless tobacco: Never   Tobacco comments:    4 cigarettes per day  Vaping Use   Vaping status: Never Used  Substance and Sexual Activity   Alcohol use: Yes    Alcohol/week: 1.0 standard drink of alcohol    Types: 1 Standard drinks or equivalent per week    Comment: twice a week   Drug use: No   Sexual activity: Not on file  Other Topics Concern   Not on file  Social History Narrative   Not on file   Social Determinants of Health  Financial Resource Strain: Not on file  Food Insecurity: Not on file  Transportation Needs: Not on file  Physical Activity: Not on file  Stress: Not on file  Social Connections: Not on file  Intimate Partner Violence: Not on file    Physical Exam: Vital signs in last 24 hours: @There  were no vitals taken for this visit. GEN: NAD EYE: Sclerae anicteric ENT: MMM CV: Non-tachycardic Pulm: CTA b/l GI: Soft, NT/ND NEURO:  Alert & Oriented x 3   Doristine Locks, DO [facility]   10/10/2022 9:19 AM

## 2022-10-10 NOTE — Interval H&P Note (Signed)
History and Physical Interval Note:  10/10/2022 9:27 AM  Hannah [name]  has presented today for surgery, with the diagnosis of hx of adenomatous polyps.  The various methods of treatment have been discussed with the patient and family. After consideration of risks, benefits and other options for treatment, the patient has consented to  Procedure(s): COLONOSCOPY WITH PROPOFOL (N/A) as a surgical intervention.  The patient's history has been reviewed, patient examined, no change in status, stable for surgery.  I have reviewed the patient's chart and labs.  Questions were answered to the patient's satisfaction.     Verlin Dike 

## 2022-10-10 NOTE — Discharge Instructions (Signed)

## 2022-10-10 NOTE — Transfer of Care (Signed)
Immediate Anesthesia Transfer of Care Note  Patient: Hannah [name]  Procedure(s) Performed: COLONOSCOPY WITH PROPOFOL SUBMUCOSAL LIFTING INJECTION POLYPECTOMY HEMOSTASIS CONTROL  Patient Location: PACU  Anesthesia Type:MAC  Level of Consciousness: awake, alert , and oriented  Airway & Oxygen Therapy: Patient Spontanous Breathing  Post-op Assessment: Report given to RN and Post -op Vital signs reviewed and stable  Post vital signs: Reviewed and stable  Last Vitals:  Vitals Value Taken Time  BP 115/59 10/10/22 1116  Temp    Pulse 107 10/10/22 1117  Resp 15 10/10/22 1117  SpO2 95 % 10/10/22 1117  Vitals shown include unfiled device data.  Last Pain:  Vitals:   10/10/22 0934  TempSrc: Temporal  PainSc: 0-No pain         Complications: No notable events documented.

## 2022-10-11 LAB — SURGICAL PATHOLOGY

## 2022-10-15 ENCOUNTER — Encounter (HOSPITAL_COMMUNITY): Payer: Self-pay | Admitting: Gastroenterology
